# Patient Record
Sex: Female | Born: 1960 | Race: White | Hispanic: No | Smoking: Never smoker
Health system: Southern US, Community
[De-identification: ages and names within clinical notes are randomized; demographics above are authoritative.]

## PROBLEM LIST (undated history)

## (undated) DIAGNOSIS — E611 Iron deficiency: Secondary | ICD-10-CM

## (undated) DIAGNOSIS — D649 Anemia, unspecified: Secondary | ICD-10-CM

## (undated) HISTORY — DX: Anemia, unspecified: D64.9

## (undated) HISTORY — DX: Iron deficiency: E61.1

## (undated) HISTORY — PX: OTHER SURGICAL HISTORY: SHX169

---

## 2009-01-24 ENCOUNTER — Other Ambulatory Visit: Admission: RE | Admit: 2009-01-24 | Discharge: 2009-01-24 | Payer: Self-pay | Admitting: Obstetrics and Gynecology

## 2012-12-29 ENCOUNTER — Encounter: Payer: Self-pay | Admitting: Obstetrics and Gynecology

## 2012-12-29 ENCOUNTER — Other Ambulatory Visit (HOSPITAL_COMMUNITY)
Admission: RE | Admit: 2012-12-29 | Discharge: 2012-12-29 | Disposition: A | Discharge status: Home / Self Care | Payer: Self-pay | Source: Ambulatory Visit | Attending: Obstetrics and Gynecology | Admitting: Obstetrics and Gynecology

## 2012-12-29 ENCOUNTER — Ambulatory Visit (INDEPENDENT_AMBULATORY_CARE_PROVIDER_SITE_OTHER): Payer: Federal, State, Local not specified - PPO | Admitting: Obstetrics and Gynecology

## 2012-12-29 VITALS — BP 124/70 | Ht 66.0 in | Wt 145.8 lb

## 2012-12-29 DIAGNOSIS — Z01419 Encounter for gynecological examination (general) (routine) without abnormal findings: Secondary | ICD-10-CM | POA: Insufficient documentation

## 2012-12-29 DIAGNOSIS — Z1151 Encounter for screening for human papillomavirus (HPV): Secondary | ICD-10-CM | POA: Insufficient documentation

## 2012-12-29 DIAGNOSIS — Z1212 Encounter for screening for malignant neoplasm of rectum: Secondary | ICD-10-CM

## 2012-12-29 NOTE — Progress Notes (Signed)
  Subjective:    Emily Mata is a 52 y.o. female who presents for an annual exam. The patient has no complaints today. The patient is sexually active. GYN screening history: last pap: was normal and 3 yr ago. The patient wears seatbelts: no. The patient participates in regular exercise: no. Has the patient ever been transfused or tattooed?: no. The patient reports that there is not domestic violence in her life.   Menstrual History: OB History   Grav Para Term Preterm Abortions TAB SAB Ect Mult Living                  Menarche age: normal*2Patient's last menstrual period was 12/15/2012.      Review of Systems Pertinent items are noted in HPI.    Objective:    BP 124/70  Ht 5\' 6"  (1.676 m)  Wt 145 lb 12.8 oz (66.134 kg)  BMI 23.54 kg/m2  LMP 12/15/2012  General Appearance:    Alert, cooperative, no distress, appears stated age  Head:    Normocephalic, without obvious abnormality, atraumatic  Eyes:    PERRL, conjunctiva/corneas clear, EOM's intact, fundi    benign, both eyes  Ears:    Normal TM's and external ear canals, both ears  Nose:   Nares normal, septum midline, mucosa normal, no drainage    or sinus tenderness  Throat:   Lips, mucosa, and tongue normal; teeth and gums normal  Neck:   Supple, symmetrical, trachea midline, no adenopathy;    thyroid:  no enlargement/tenderness/nodules; no carotid   bruit or JVD  Back:     Symmetric, no curvature, ROM normal, no CVA tenderness  Lungs:     Clear to auscultation bilaterally, respirations unlabored  Chest Wall:    No tenderness or deformity   Heart:    Regular rate and rhythm, S1 and S2 normal, no murmur, rub   or gallop  Breast Exam:    No tenderness, masses, or nipple abnormality  Abdomen:     Soft, non-tender, bowel sounds active all four quadrants,    no masses, no organomegaly  Genitalia:    Normal female without lesion, discharge or tenderness  Rectal:    Normal tone, normal prostate, no masses or tenderness;  guaiac negative stool rectocele to 90 degrees  Extremities:   Extremities normal, atraumatic, no cyanosis or edema  Pulses:   2+ and symmetric all extremities  Skin:   Skin color, texture, turgor normal, no rashes or lesions  Lymph nodes:   Cervical, supraclavicular, and axillary nodes normal  Neurologic:   CNII-XII intact, normal strength, sensation and reflexes    throughout  .    Assessment:    Healthy female exam.    Plan:     All questions answered. Thin prep Pap smear.

## 2012-12-29 NOTE — Patient Instructions (Addendum)
The llq pain is likely intestinal. Try stool softeners, first, such a miralax, increase fiber.

## 2013-03-17 ENCOUNTER — Other Ambulatory Visit: Payer: Self-pay

## 2014-01-25 ENCOUNTER — Ambulatory Visit (INDEPENDENT_AMBULATORY_CARE_PROVIDER_SITE_OTHER): Payer: Federal, State, Local not specified - PPO

## 2014-01-25 ENCOUNTER — Ambulatory Visit (INDEPENDENT_AMBULATORY_CARE_PROVIDER_SITE_OTHER): Payer: Federal, State, Local not specified - PPO | Admitting: Podiatrist

## 2014-01-25 ENCOUNTER — Encounter: Payer: Self-pay | Admitting: Podiatrist

## 2014-01-25 VITALS — BP 125/71 | HR 66 | Resp 18

## 2014-01-25 DIAGNOSIS — R52 Pain, unspecified: Secondary | ICD-10-CM

## 2014-01-25 DIAGNOSIS — M2040 Other hammer toe(s) (acquired), unspecified foot: Secondary | ICD-10-CM

## 2014-01-25 DIAGNOSIS — M2041 Other hammer toe(s) (acquired), right foot: Secondary | ICD-10-CM

## 2014-01-25 NOTE — Patient Instructions (Signed)
Hammer Toes Hammer toes is a condition in which one or more of your toes is permanently flexed. CAUSES  This happens when a muscle imbalance or abnormal bone length makes your small toes buckle. This causes the toe joint to contract and the strong cord-like bands that attach muscles to the bones (tendons) in your toes to shorten.  SIGNS AND SYMPTOMS  Common symptoms of flexible hammer toes include:   A buildup of skin cells (corns). Corns occur where boney bumps come in frequent contact with hard surfaces. For example, where your shoes press and rub.  Irritation.  Inflammation.  Pain.  Limited motion in your toes. DIAGNOSIS  Hammer toes are diagnosed through a physical exam of your toes. During the exam, your health care provider may try to reproduce your symptoms by manipulating your foot. Often, X-ray exams are done to determine the degree of deformity and to make sure that the cause is not a fracture.  TREATMENT  Hammer toes can be treated with corrective surgery. There are several types of surgical procedures that can treat hammer toes. The most common procedures include:  Arthroplasty--A portion of the joint is surgically removed and your toe is straightened. The gap fills in with fibrous tissue. This procedure helps treat pain and deformity and helps restore function.  Fusion--Cartilage between the two bones of the affected joint is taken out and the bones fuse together into one longer bone. This helps keep your toe stable and reduces pain but leaves your toe stiff, yet straight.  Implantation--A portion of your bone is removed and replaced with an implant to restore motion.  Flexor tendon transfers--This procedure repositions the tendons that curl the toes down (flexor tendons). This may be done to release the deforming force that causes your toe to buckle. Several of these procedures require fixing your toe with a pin that is visible at the tip of your toe. The pin keeps the toe  straight during healing. Your health care provider will remove the pin usually within 4-8 weeks after the procedure.  Document Released: 04/25/2000 Document Revised: 05/03/2013 Document Reviewed: 01/03/2013 [employer] Patient Information 2015 [employer]. This information is not intended to replace advice given to you by your health care provider. Make sure you discuss any questions you have with your health care provider.  

## 2014-01-25 NOTE — Progress Notes (Signed)
   Subjective:    Patient ID: Emily Mata, female    DOB: Apr 09, 1961, 53 y.o.   MRN: [id number]  HPI i am having trouble with the 4th toe on my right foot and has been going on for about 6 mos and burns and throbs and hurts to walk and there is no numbness or tingling and no swelling and hurts with shoes    Review of Systems  All other systems reviewed and are negative.      Objective:   Physical Exam Patient is awake, alert, and oriented x 3.  In no acute distress.  Vascular status is intact with palpable pedal pulses at 2/4 DP and PT bilateral and capillary refill time within normal limits. Neurological sensation is also intact bilaterally via Semmes Weinstein monofilament at 5/5 sites. Light touch, vibratory sensation, Achilles tendon reflex is intact. Dermatological exam reveals skin color, turger and texture as normal. No open lesions present.  Musculature intact with dorsiflexion, plantarflexion, inversion, eversion.  Hammertoe is present fourth digit of the right foot. Is semirigid and contracture and painful with ambulation.     Assessment & Plan:  Hammertoe fourth toe right foot  Plan: Discussed conservative treatments including shoe gear changes, padding and taping. Also discussed surgical options of fixing the hammertoe. She will consider her options and call if she would like to have the toe fixed otherwise she'll continue to try conservative treatments.

## 2014-08-21 ENCOUNTER — Encounter: Payer: Self-pay | Admitting: Obstetrics and Gynecology

## 2015-03-02 ENCOUNTER — Encounter: Payer: Self-pay | Admitting: *Deleted

## 2015-03-07 ENCOUNTER — Ambulatory Visit: Payer: Self-pay | Admitting: *Deleted

## 2015-05-08 ENCOUNTER — Encounter: Payer: Self-pay | Admitting: *Deleted

## 2015-05-16 ENCOUNTER — Encounter: Payer: Self-pay | Admitting: Vascular Surgery

## 2015-05-16 ENCOUNTER — Ambulatory Visit (INDEPENDENT_AMBULATORY_CARE_PROVIDER_SITE_OTHER): Payer: Federal, State, Local not specified - PPO | Admitting: *Deleted

## 2015-05-16 DIAGNOSIS — I8390 Asymptomatic varicose veins of unspecified lower extremity: Secondary | ICD-10-CM

## 2015-05-16 DIAGNOSIS — I8393 Asymptomatic varicose veins of bilateral lower extremities: Secondary | ICD-10-CM

## 2015-05-16 NOTE — Progress Notes (Signed)
X=.3% Sotradecol administered with a 27g butterfly.  Patient received a total of 6cc.  Treated all areas of concern-combo of spiders and retics. Easy access. Tol well. Will follow her prn.  Photos: Yes.    Compression stockings applied: Yes.

## 2016-11-24 ENCOUNTER — Other Ambulatory Visit (HOSPITAL_COMMUNITY)
Admission: RE | Admit: 2016-11-24 | Discharge: 2016-11-24 | Disposition: A | Discharge status: Home / Self Care | Payer: Federal, State, Local not specified - PPO | Source: Ambulatory Visit | Attending: Obstetrics and Gynecology | Admitting: Obstetrics and Gynecology

## 2016-11-24 ENCOUNTER — Encounter: Payer: Self-pay | Admitting: Obstetrics and Gynecology

## 2016-11-24 ENCOUNTER — Ambulatory Visit (INDEPENDENT_AMBULATORY_CARE_PROVIDER_SITE_OTHER): Payer: Federal, State, Local not specified - PPO | Admitting: Obstetrics and Gynecology

## 2016-11-24 VITALS — BP 150/80 | HR 66 | Ht 67.5 in | Wt 149.4 lb

## 2016-11-24 DIAGNOSIS — Z1212 Encounter for screening for malignant neoplasm of rectum: Secondary | ICD-10-CM

## 2016-11-24 DIAGNOSIS — Z01419 Encounter for gynecological examination (general) (routine) without abnormal findings: Secondary | ICD-10-CM | POA: Diagnosis not present

## 2016-11-24 DIAGNOSIS — N951 Menopausal and female climacteric states: Secondary | ICD-10-CM | POA: Insufficient documentation

## 2016-11-24 LAB — HEMOCCULT GUIAC POC 1CARD (OFFICE): Fecal Occult Blood, POC: NEGATIVE

## 2016-11-24 MED ORDER — MEDROXYPROGESTERONE ACETATE 10 MG PO TABS: 10.0000 mg | ORAL_TABLET | Freq: Every day | ORAL | 3 refills | Status: DC

## 2016-11-24 NOTE — Progress Notes (Signed)
  Assessment:  Annual Gyn Exam  perimenopause Plan:  1. pap smear done, next pap due 3 years 2. return annually or prn 3    Annual mammogram advised  4.  Provera prescribed 10 mg x 14 d if no menses in 3 months. Subjective:  Emily Mata is a 56 y.o. female G2P2002 who presents for annual exam. Patient's last menstrual period was 10/29/2016 (exact date). The patient has complaints today of irregular heavy periods since last year. Her second last period was in 05/18, and her last period was from June 19th to July 2nd. She reports her LMP was very heavy. Pt has associated symptoms of intermittent hot sweats. She reports no alleviating factors. She is concerned about menopause. She has occasional problems with hemorrhoids. Pt sees PCP Dr. Jearl KlinefelterEggleston for her regular check ups. She had her colonoscopy done 09/17.  The following portions of the patient's history were reviewed and updated as appropriate: allergies, current medications, past family history, past medical history, past social history, past surgical history and problem list. Past Medical History:  Diagnosis Date  . Iron deficiency     Past Surgical History:  Procedure Laterality Date  . urinary tract surgery       Current Outpatient Prescriptions:  .  metroNIDAZOLE (METROCREAM) 0.75 % cream, Apply 1 application topically at bedtime. , Disp: , Rfl:  .  UNABLE TO FIND, daily. Anti-inflammatory-once daily; Mega foods-once daily; Blood builder-once-twice a week, Disp: , Rfl:   Review of Systems Constitutional: negative Gastrointestinal: negative Genitourinary: Negative  Objective:  BP (!) 150/80 (BP Location: Right Arm, Patient Position: Sitting, Cuff Size: Normal)   Pulse 66   Ht 5' 7.5" (1.715 m)   Wt 149 lb 6.4 oz (67.8 kg)   LMP 10/29/2016 (Exact Date)   BMI 23.05 kg/m    BMI: Body mass index is 23.05 kg/m.  General Appearance: Alert, appropriate appearance for age. No acute distress HEENT: Grossly normal Neck /  Thyroid:  Cardiovascular: RRR; normal S1, S2, no murmur Lungs: CTA bilaterally Back: No CVAT Breast Exam: No dimpling, nipple retraction or discharge. No masses or nodes., Normal to inspection, Normal breast tissue bilaterally. Gastrointestinal: Soft, non-tender, no masses or organomegaly Pelvic Exam: Vulva and vagina appear normal. Bimanual exam reveals normal uterus and adnexa. Vaginal: normal mucosa without prolapse or lesions, normal secretions Cervix: normal secretions Uterus: normal Adnexa: normal bimanual exam  Rectovaginal: guaiac negative stool obtained, internal hemorrhoids, moderate rectocele Lymphatic Exam: Non-palpable nodes in neck, clavicular, axillary, or inguinal regions  Skin: no rash or abnormalities Neurologic: Normal gait and speech, no tremor  Psychiatric: Alert and oriented, appropriate affect.  Urinalysis: Not done  Christin BachJohn Vernie Vinciguerra. MD Pgr [phone] 10:11 AM     By signing my name below, I, Redge GainerIzna Ahmed, attest that this documentation has been prepared under the direction and in the presence of Tilda BurrowFerguson, Justinian Miano V, MD. Electronically Signed: Redge GainerIzna Ahmed, ED Scribe. 11/24/16. 10:11 AM.  I personally performed the services described in this documentation, which was SCRIBED in my presence. The recorded information has been reviewed and considered accurate. It has been edited as necessary during review. Tilda BurrowFERGUSON,Damita Eppard V, MD

## 2016-11-26 LAB — CYTOLOGY - PAP
DIAGNOSIS: NEGATIVE
HPV (WINDOPATH): NOT DETECTED

## 2017-07-07 ENCOUNTER — Ambulatory Visit: Payer: Federal, State, Local not specified - PPO | Admitting: Adult Health

## 2017-07-07 ENCOUNTER — Encounter: Payer: Self-pay | Admitting: Adult Health

## 2017-07-07 VITALS — BP 120/80 | HR 98 | Ht 66.0 in | Wt 150.0 lb

## 2017-07-07 DIAGNOSIS — N921 Excessive and frequent menstruation with irregular cycle: Secondary | ICD-10-CM | POA: Diagnosis not present

## 2017-07-07 DIAGNOSIS — N946 Dysmenorrhea, unspecified: Secondary | ICD-10-CM

## 2017-07-07 DIAGNOSIS — R51 Headache: Secondary | ICD-10-CM

## 2017-07-07 DIAGNOSIS — N951 Menopausal and female climacteric states: Secondary | ICD-10-CM

## 2017-07-07 DIAGNOSIS — R0602 Shortness of breath: Secondary | ICD-10-CM | POA: Diagnosis not present

## 2017-07-07 DIAGNOSIS — R5383 Other fatigue: Secondary | ICD-10-CM | POA: Diagnosis not present

## 2017-07-07 NOTE — Patient Instructions (Signed)
Menopause and Hormone Replacement Therapy What is hormone replacement therapy? Hormone replacement therapy (HRT) is the use of artificial (synthetic) hormones to replace hormones that your body stops producing during menopause. Menopause is the normal time of life when menstrual periods stop completely and the ovaries stop producing the female hormones estrogen and progesterone. This lack of hormones can affect your health and cause undesirable symptoms. HRT can relieve some of those symptoms. What are my options for HRT? HRT may consist of the synthetic hormones estrogen and progestin, or it may consist of only estrogen (estrogen-only therapy). You and your health care provider will decide which form of HRT is best for you. If you choose to be on HRT and you have a uterus, estrogen and progestin are usually prescribed. Estrogen-only therapy is used for women who do not have a uterus. Possible options for taking HRT include:  Pills.  Patches.  Gels.  Sprays.  Vaginal cream.  Vaginal rings.  Vaginal inserts.  The amount of hormone(s) that you take and how long you take the hormone(s) varies depending on your individual health. It is important to:  Begin HRT with the lowest possible dosage.  Stop HRT as soon as your health care provider tells you to stop.  Work with your health care provider so that you feel informed and comfortable with your decisions.  What are the benefits of HRT? HRT can reduce the frequency and severity of menopausal symptoms. Benefits of HRT vary depending on the menopausal symptoms that you have, the severity of your symptoms, and your overall health. HRT may help to improve the following menopausal symptoms:  Hot flashes and night sweats. These are sudden feelings of heat that spread over the face and body. The skin may turn red, like a blush. Night sweats are hot flashes that happen while you are sleeping or trying to sleep.  Bone loss (osteoporosis). The  body loses calcium more quickly after menopause, causing the bones to become weaker. This can increase the risk for bone breaks (fractures).  Vaginal dryness. The lining of the vagina can become thin and dry, which can cause pain during sexual intercourse or cause infection, burning, or itching.  Urinary tract infections.  Urinary incontinence. This is a decreased ability to control when you urinate.  Irritability.  Short-term memory problems.  What are the risks of HRT? Risks of HRT vary depending on your individual health and medical history. Risks of HRT also depend on whether you receive both estrogen and progestin or you receive estrogen only.HRT may increase the risk of:  Spotting. This is when a small amount of bloodleaks from the vagina unexpectedly.  Endometrial cancer. This cancer is in the lining of the uterus (endometrium).  Breast cancer.  Increased density of breast tissue. This can make it harder to find breast cancer on a breast X-ray (mammogram).  Stroke.  Heart attack.  Blood clots.  Gallbladder disease.  Risks of HRT can increase if you have any of the following conditions:  Endometrial cancer.  Liver disease.  Heart disease.  Breast cancer.  History of blood clots.  History of stroke.  How should I care for myself while I am on HRT?  Take over-the-counter and prescription medicines only as told by your health care provider.  Get mammograms, pelvic exams, and medical checkups as often as told by your health care provider.  Have Pap tests done as often as told by your health care provider. A Pap test is sometimes called a Pap   smear. It is a screening test that is used to check for signs of cancer of the cervix and vagina. A Pap test can also identify the presence of infection or precancerous changes. Pap tests may be done: ? Every 3 years, starting at age 21. ? Every 5 years, starting after age 30, in combination with testing for human  papillomavirus (HPV). ? More often or less often depending on other medical conditions you have, your age, and other risk factors.  It is your responsibility to get your Pap test results. Ask your health care provider or the department performing the test when your results will be ready.  Keep all follow-up visits as told by your health care provider. This is important. When should I seek medical care? Talk with your health care provider if:  You have any of these: ? Pain or swelling in your legs. ? Shortness of breath. ? Chest pain. ? Lumps or changes in your breasts or armpits. ? Slurred speech. ? Pain, burning, or bleeding when you urine.  You develop any of these: ? Unusual vaginal bleeding. ? Dizziness or headaches. ? Weakness or numbness in any part of your arms or legs. ? Pain in your abdomen.  This information is not intended to replace advice given to you by your health care provider. Make sure you discuss any questions you have with your health care provider. Document Released: 01/25/2003 Document Revised: 03/25/2016 Document Reviewed: 10/30/2014 Elsevier Interactive Patient Education  2017 [employer] Menopause Menopause is the normal time of life when menstrual periods stop completely. Menopause is complete when you have missed 12 consecutive menstrual periods. It usually occurs between the ages of 48 years and 55 years. Very rarely does a woman develop menopause before the age of 40 years. At menopause, your ovaries stop producing the female hormones estrogen and progesterone. This can cause undesirable symptoms and also affect your health. Sometimes the symptoms may occur 4-5 years before the menopause begins. There is no relationship between menopause and:  Oral contraceptives.  Number of children you had.  Race.  The age your menstrual periods started (menarche).  Heavy smokers and very thin women may develop menopause earlier in life. What are the  causes?  The ovaries stop producing the female hormones estrogen and progesterone. Other causes include:  Surgery to remove both ovaries.  The ovaries stop functioning for no known reason.  Tumors of the pituitary gland in the brain.  Medical disease that affects the ovaries and hormone production.  Radiation treatment to the abdomen or pelvis.  Chemotherapy that affects the ovaries.  What are the signs or symptoms?  Hot flashes.  Night sweats.  Decrease in sex drive.  Vaginal dryness and thinning of the vagina causing painful intercourse.  Dryness of the skin and developing wrinkles.  Headaches.  Tiredness.  Irritability.  Memory problems.  Weight gain.  Bladder infections.  Hair growth of the face and chest.  Infertility. More serious symptoms include:  Loss of bone (osteoporosis) causing breaks (fractures).  Depression.  Hardening and narrowing of the arteries (atherosclerosis) causing heart attacks and strokes.  How is this diagnosed?  When the menstrual periods have stopped for 12 straight months.  Physical exam.  Hormone studies of the blood. How is this treated? There are many treatment choices and nearly as many questions about them. The decisions to treat or not to treat menopausal changes is an individual choice made with your health care provider. Your health care provider can discuss   the treatments with you. Together, you can decide which treatment will work best for you. Your treatment choices may include:  Hormone therapy (estrogen and progesterone).  Non-hormonal medicines.  Treating the individual symptoms with medicine (for example antidepressants for depression).  Herbal medicines that may help specific symptoms.  Counseling by a psychiatrist or psychologist.  Group therapy.  Lifestyle changes including: ? Eating healthy. ? Regular exercise. ? Limiting caffeine and alcohol. ? Stress management and meditation.  No  treatment.  Follow these instructions at home:  Take the medicine your health care provider gives you as directed.  Get plenty of sleep and rest.  Exercise regularly.  Eat a diet that contains calcium (good for the bones) and soy products (acts like estrogen hormone).  Avoid alcoholic beverages.  Do not smoke.  If you have hot flashes, dress in layers.  Take supplements, calcium, and vitamin D to strengthen bones.  You can use over-the-counter lubricants or moisturizers for vaginal dryness.  Group therapy is sometimes very helpful.  Acupuncture may be helpful in some cases. Contact a health care provider if:  You are not sure you are in menopause.  You are having menopausal symptoms and need advice and treatment.  You are still having menstrual periods after age 55 years.  You have pain with intercourse.  Menopause is complete (no menstrual period for 12 months) and you develop vaginal bleeding.  You need a referral to a specialist (gynecologist, psychiatrist, or psychologist) for treatment. Get help right away if:  You have severe depression.  You have excessive vaginal bleeding.  You fell and think you have a broken bone.  You have pain when you urinate.  You develop leg or chest pain.  You have a fast pounding heart beat (palpitations).  You have severe headaches.  You develop vision problems.  You feel a lump in your breast.  You have abdominal pain or severe indigestion. This information is not intended to replace advice given to you by your health care provider. Make sure you discuss any questions you have with your health care provider. Document Released: 07/19/2003 Document Revised: 10/04/2015 Document Reviewed: 11/25/2012 Elsevier Interactive Patient Education  2017 Elsevier Inc.  

## 2017-07-07 NOTE — Progress Notes (Signed)
Subjective:     Patient ID: Emily Mata, female   DOB: 03-06-1961, 57 y.o.   MRN: [id number]  HPI Emily Mata is a 57 year old white female, in complaining of heavy periods, with cramps and BTB at times.Feels tired with headaches and short of breath with exertion. She saw Dr Emelda FearFerguson in July 2018 and had her pap and physical. Has been anemic in the past.  PCP is Dr Janeece [place]ggleston [place].   Review of Systems +heavy bleeding for 4/7 days BTB at times +cramps +tired +headaches and shortness of breath with exertion Voids more often No pain with sex  Reviewed past medical,surgical, social and family history. Reviewed medications and allergies.     Objective:   Physical Exam BP 120/80 (BP Location: Left Arm, Patient Position: Sitting, Cuff Size: Small)   Pulse 98   Ht 5\' 6"  (1.676 m)   Wt 150 lb (68 kg)   LMP 06/28/2017   BMI 24.21 kg/m  Skin warm and dry. Neck: mid line trachea, normal thyroid, good ROM, no lymphadenopathy noted. Lungs: clear to ausculation bilaterally. Cardiovascular: regular rate and rhythm. Will check labs and get GYN US to assess uterus and ovaries and will discuss options when results back.     Assessment:     1. Menorrhagia with irregular cycle   2. Dysmenorrhea   3. Perimenopause       Plan:     Check CBC with diff,CMP,TSH and FSH Return in 1 week for GYN US Review handout on menopause and menopause and HRT Will talk when results back

## 2017-07-08 ENCOUNTER — Telehealth: Payer: Self-pay | Admitting: Adult Health

## 2017-07-08 LAB — CBC WITH DIFFERENTIAL/PLATELET
BASOS ABS: 0 10*3/uL (ref 0.0–0.2)
Basos: 1 %
EOS (ABSOLUTE): 0.1 10*3/uL (ref 0.0–0.4)
Eos: 1 %
Hematocrit: 38.5 % (ref 34.0–46.6)
Hemoglobin: 13 g/dL (ref 11.1–15.9)
Immature Grans (Abs): 0 10*3/uL (ref 0.0–0.1)
Immature Granulocytes: 0 %
LYMPHS: 30 %
Lymphocytes Absolute: 1.6 10*3/uL (ref 0.7–3.1)
MCH: 29 pg (ref 26.6–33.0)
MCHC: 33.8 g/dL (ref 31.5–35.7)
MCV: 86 fL (ref 79–97)
MONOS ABS: 0.3 10*3/uL (ref 0.1–0.9)
Monocytes: 5 %
NEUTROS ABS: 3.4 10*3/uL (ref 1.4–7.0)
NEUTROS PCT: 63 %
PLATELETS: 239 10*3/uL (ref 150–379)
RBC: 4.49 x10E6/uL (ref 3.77–5.28)
RDW: 14.3 % (ref 12.3–15.4)
WBC: 5.4 10*3/uL (ref 3.4–10.8)

## 2017-07-08 LAB — FOLLICLE STIMULATING HORMONE: FSH: 22.5 m[IU]/mL

## 2017-07-08 LAB — COMPREHENSIVE METABOLIC PANEL
A/G RATIO: 2 (ref 1.2–2.2)
ALK PHOS: 70 IU/L (ref 39–117)
ALT: 14 IU/L (ref 0–32)
AST: 19 IU/L (ref 0–40)
Albumin: 4.8 g/dL (ref 3.5–5.5)
BUN/Creatinine Ratio: 17 (ref 9–23)
BUN: 16 mg/dL (ref 6–24)
Bilirubin Total: 0.2 mg/dL (ref 0.0–1.2)
CHLORIDE: 104 mmol/L (ref 96–106)
CO2: 21 mmol/L (ref 20–29)
Calcium: 10.1 mg/dL (ref 8.7–10.2)
Creatinine, Ser: 0.96 mg/dL (ref 0.57–1.00)
GFR calc non Af Amer: 67 mL/min/{1.73_m2} (ref >59)
GFR, EST AFRICAN AMERICAN: 78 mL/min/{1.73_m2} (ref >59)
GLUCOSE: 92 mg/dL (ref 65–99)
Globulin, Total: 2.4 g/dL (ref 1.5–4.5)
POTASSIUM: 4.6 mmol/L (ref 3.5–5.2)
Sodium: 142 mmol/L (ref 134–144)
TOTAL PROTEIN: 7.2 g/dL (ref 6.0–8.5)

## 2017-07-08 LAB — TSH: TSH: 4.55 u[IU]/mL — AB (ref 0.450–4.500)

## 2017-07-08 NOTE — Telephone Encounter (Signed)
Pt aware of labs, will recheck TSH(and free T4) in about 4 weeks, since slightly elevated,

## 2017-07-13 ENCOUNTER — Other Ambulatory Visit: Payer: Federal, State, Local not specified - PPO

## 2017-07-14 ENCOUNTER — Ambulatory Visit (INDEPENDENT_AMBULATORY_CARE_PROVIDER_SITE_OTHER): Payer: Federal, State, Local not specified - PPO

## 2017-07-14 DIAGNOSIS — N921 Excessive and frequent menstruation with irregular cycle: Secondary | ICD-10-CM | POA: Diagnosis not present

## 2017-07-14 DIAGNOSIS — N946 Dysmenorrhea, unspecified: Secondary | ICD-10-CM

## 2017-07-14 DIAGNOSIS — N83201 Unspecified ovarian cyst, right side: Secondary | ICD-10-CM | POA: Diagnosis not present

## 2017-07-14 NOTE — Progress Notes (Signed)
PELVIC ULTRASOUND:homogeneous anteverted uterus,wnl,EEC 16 mm,mult.simple nabothian cysts,simple right ovarian cyst 2.6 x 2.5 x 2.6 cm,normal left ovary,ovaries appear mobile

## 2017-07-15 ENCOUNTER — Telehealth: Payer: Self-pay | Admitting: Adult Health

## 2017-07-15 MED ORDER — NORETHINDRONE ACETATE 5 MG PO TABS: 5.0000 mg | ORAL_TABLET | Freq: Every day | ORAL | 3 refills | Status: DC

## 2017-07-15 NOTE — Telephone Encounter (Signed)
Pt aware US normal with simple cyst right ovary, will try aygestin 5 ng daily to stop periods from being so heavy. Reminded her to check TSH and  Free T4 in about 4 weeks

## 2017-09-23 ENCOUNTER — Other Ambulatory Visit: Payer: Federal, State, Local not specified - PPO

## 2017-09-23 DIAGNOSIS — Z1329 Encounter for screening for other suspected endocrine disorder: Secondary | ICD-10-CM

## 2017-09-24 LAB — T4, FREE: FREE T4: 1.29 ng/dL (ref 0.82–1.77)

## 2017-09-24 LAB — TSH: TSH: 2.89 u[IU]/mL (ref 0.450–4.500)

## 2017-09-30 ENCOUNTER — Other Ambulatory Visit: Payer: Self-pay | Admitting: Adult Health

## 2017-10-23 ENCOUNTER — Other Ambulatory Visit: Payer: Self-pay | Admitting: Adult Health

## 2017-11-19 ENCOUNTER — Other Ambulatory Visit: Payer: Self-pay | Admitting: Adult Health

## 2017-12-12 ENCOUNTER — Other Ambulatory Visit: Payer: Self-pay | Admitting: Adult Health

## 2018-03-15 ENCOUNTER — Other Ambulatory Visit: Payer: Self-pay | Admitting: Adult Health

## 2018-09-07 ENCOUNTER — Other Ambulatory Visit: Payer: Self-pay | Admitting: Adult Health

## 2019-01-12 ENCOUNTER — Encounter: Payer: Self-pay | Admitting: *Deleted

## 2019-01-13 ENCOUNTER — Ambulatory Visit: Payer: Federal, State, Local not specified - PPO | Admitting: Obstetrics and Gynecology

## 2019-01-13 ENCOUNTER — Other Ambulatory Visit: Payer: Self-pay

## 2019-01-13 ENCOUNTER — Encounter: Payer: Self-pay | Admitting: Obstetrics and Gynecology

## 2019-01-13 VITALS — BP 118/71 | HR 71 | Ht 66.0 in | Wt 153.8 lb

## 2019-01-13 DIAGNOSIS — R002 Palpitations: Secondary | ICD-10-CM | POA: Diagnosis not present

## 2019-01-13 NOTE — Progress Notes (Signed)
Patient ID: Emily Mata, female   DOB: 1960-06-27, 58 y.o.   MRN: [id number]    [facility] Visit  @DATE @            Patient name: Emily Mata MRN [id number]  Date of birth: 1960-12-17  CC & HPI:  Emily Mata is a 58 y.o. female presenting today to discuss the side effects of her medications and her iron levels.   The pt reports reports that she has leg cramps at night, lightheadedness, and fluttering in her chest. The fluttering sometimes occurs when she is laying down and sometimes when she stands up. She reports that she gets dizzy if she stands up too quickly. These symptoms began about 2 months ago.   She has been on Aygestin for almost a year and has not had a period since she started taking it. Before then, her periods were extremely heavy and lasted for a long time. The patient denies thyroid abnormalities, problems with her weight, frequent hot flashes, fever, chills or any other symptoms or complaints at this time.   ROS:  ROS  +lightheadedness +fluttering in her chest +leg cramps at night  -frequent hot flashes -thyroid abnormalities -fever -chills All systems are negative except as noted in the HPI and PMH.   Pertinent History Reviewed:   Reviewed: Medical         Past Medical History:  Diagnosis Date  . Anemia   . Iron deficiency                               Surgical Hx:    Past Surgical History:  Procedure Laterality Date  . urinary tract surgery     Medications: Reviewed & Updated - see associated section                       Current Outpatient Medications:  .  aspirin EC 81 MG tablet, Take 81 mg by mouth daily., Disp: , Rfl:  .  norethindrone (AYGESTIN) 5 MG tablet, TAKE 1 TABLET BY MOUTH EVERY DAY, Disp: 90 tablet, Rfl: 1 .  UNABLE TO FIND, daily. Anti-inflammatory-once daily; Mega foods-once daily; Blood builder-once-twice a week, Disp: , Rfl:    Social History: Reviewed -  reports that she has never smoked. She has never used  smokeless tobacco.  Objective Findings:  Vitals: Blood pressure 118/71, pulse 71, height 5\' 6"  (1.676 m), weight 153 lb 12.8 oz (69.8 kg), last menstrual period 02/07/2018.  PHYSICAL EXAMINATION General appearance - alert, well appearing, and in no distress, oriented to person, place, and time and normal appearing weight Mental status - alert, oriented to person, place, and time, normal mood, behavior, speech, dress, motor activity, and thought processes, affect appropriate to mood  PELVIC DEFERRED  Assessment & Plan:   A:  1. Heart palpitations r/o cardiac origin 2. Normal exam at this time  P:  1. Recommend F/U with PCP -- would recommend holter monitor 2. Discontinue Aygestin to see if periods have stopped, as she is perimenopausal 3. F/U in 3 months by phone  By signing my name below, I, De Burrs, attest that this documentation has been prepared under the direction and in the presence of Jonnie Kind, MD. Electronically Signed: De Burrs, Medical Scribe. 01/13/19. 10:25 AM.  I personally performed the services described in this documentation, which was SCRIBED in my presence. The recorded information has been  reviewed and considered accurate. It has been edited as necessary during review. Jonnie Kind, MD

## 2019-04-20 ENCOUNTER — Encounter: Payer: Self-pay | Admitting: Obstetrics and Gynecology

## 2019-04-20 ENCOUNTER — Telehealth (INDEPENDENT_AMBULATORY_CARE_PROVIDER_SITE_OTHER): Payer: Federal, State, Local not specified - PPO | Admitting: Obstetrics and Gynecology

## 2019-04-20 DIAGNOSIS — N951 Menopausal and female climacteric states: Secondary | ICD-10-CM | POA: Diagnosis not present

## 2019-04-20 DIAGNOSIS — G4700 Insomnia, unspecified: Secondary | ICD-10-CM

## 2019-04-20 MED ORDER — ESTRADIOL 0.1 MG/24HR TD PTTW: 1.0000 | MEDICATED_PATCH | TRANSDERMAL | 12 refills | Status: DC

## 2019-04-20 NOTE — Progress Notes (Signed)
Patient ID: Emily Mata, female   DOB: Aug 02, 1960, 58 y.o.   MRN: [id number]     TELEHEALTH GYNECOLOGY VIRTUAL VIDEO VISIT ENCOUNTER NOTE  Provider location: [facility]   I connected with Emily Mata on 04/20/2019 at  8:30 AM EST by MyChart Video Encounter at home and verified that I am speaking with the correct person using two identifiers.   I discussed the limitations, risks, security and privacy concerns of performing an evaluation and management service virtually and the availability of in person appointments. I also discussed with the patient that there may be a patient responsible charge related to this service. The patient expressed understanding and agreed to proceed.   History:  Emily Mata is a 58 y.o. G37P2002 female being evaluated today to discuss her medications. She was on Aygestin for almost a year and did not have a period while taking it. She discontinued it 3 months ago to see if she would have a period, as she is perimenopausal.   She had some spotting in October, but no other bleeding. Her hot sweats have been manageable and are only bad at night. She notes that she can handle the night sweats without hormone management right now. However, she is having trouble sleeping at night and tosses and turns a lot.  She denies any abnormal vaginal discharge, bleeding, pelvic pain or other concerns.        Past Medical History:  Diagnosis Date  . Anemia   . Iron deficiency    Past Surgical History:  Procedure Laterality Date  . urinary tract surgery     The following portions of the patient's history were reviewed and updated as appropriate: allergies, current medications, past family history, past medical history, past social history, past surgical history and problem list.   Health Maintenance:  Normal pap and negative HRHPV on 11/24/2016.  No mammogram on record.   Review of Systems:  Pertinent items noted in HPI and remainder  of comprehensive ROS otherwise negative.  Physical Exam:   General:  Alert, oriented and cooperative. Patient appears to be in no acute distress.  Mental Status: Normal mood and affect. Normal behavior. Normal judgment and thought content.   Respiratory: Normal respiratory effort, no problems with respiration noted  Rest of physical exam deferred due to type of encounter  Labs and Imaging No results found for this or any previous visit (from the past 336 hour(s)). No results found.     Assessment and Plan:         1. Hormone management 2. Perimenopausal 3. Vasomotor symptoms, insomnia  Plan: 1. Vivelle dot patches for 1 year 2. Pt will F/U via MyChart in 3 months. Will consider adding Prometrium at this time.  Discussion: Discussed with pt risks and benefits of hormone management such as Prempro, Prometrium, Provera and patches to help the pt's night sweats if they become unmanageable. At end of discussion, pt had opportunity to ask questions and has no further questions at this time.  Specific discussion of hormone management as noted above. Greater than 50% was spent in counseling and coordination of care with the patient.  Total time greater than: 20 minutes.   I discussed the assessment and treatment plan with the patient. The patient was provided an opportunity to ask questions and all were answered. The patient agreed with the plan and demonstrated an understanding of the instructions.   The patient was advised to call back or seek  an in-person evaluation/go to the ED if the symptoms worsen or if the condition fails to improve as anticipated.  I provided 20 minutes of face-to-face time during this encounter.  By signing my name below, I, De Burrs, attest that this documentation has been prepared under the direction and in the presence of Jonnie Kind, MD. Electronically Signed: De Burrs, Medical Scribe. 04/20/19. 9:36 AM.  I personally performed the services  described in this documentation, which was SCRIBED in my presence. The recorded information has been reviewed and considered accurate. It has been edited as necessary during review. Jonnie Kind, MD

## 2019-04-25 ENCOUNTER — Other Ambulatory Visit: Payer: Self-pay | Admitting: *Deleted

## 2019-04-25 MED ORDER — ESTRADIOL 0.1 MG/24HR TD PTTW: 1.0000 | MEDICATED_PATCH | TRANSDERMAL | 12 refills | Status: DC

## 2019-04-25 NOTE — Telephone Encounter (Signed)
Patient states pharmacy has not received medication Dr Glo Herring was going to send in.  Informed patient it looks like it was faxed but will resend in electronically. Pt verbalized understanding.

## 2019-05-02 ENCOUNTER — Other Ambulatory Visit: Payer: Self-pay | Admitting: *Deleted

## 2019-05-02 MED ORDER — ESTRADIOL 0.1 MG/24HR TD PTTW: 1.0000 | MEDICATED_PATCH | TRANSDERMAL | 12 refills | Status: DC

## 2019-05-02 NOTE — Telephone Encounter (Signed)
Patient states pharmacy has still not received her prescription for the estradiol patch. Faxed on Friday.  Will try to send electronically.

## 2019-09-12 ENCOUNTER — Other Ambulatory Visit: Payer: Self-pay | Admitting: Obstetrics and Gynecology

## 2019-12-06 NOTE — Progress Notes (Signed)
PATIENT ID: Emily Mata, female     DOB: 1961/03/03, 59 y.o.     MRN: [id number]   [facility] Visit  12/06/19     PATIENT NAME: Emily Mata     MRN [id number]     DOB: Mar 28, 1961  CC & HPI:  No chief complaint on file.  Emily Mata is a 59 y.o. female presenting today for an evaluation of abnormal vaginal bleeding.  She notes that she is having to change her menses products every 30 minutes to 2 hours due to the severity of the menses.  ROS:  Review of Systems  Constitutional: Negative.   HENT: Negative.   Eyes: Negative.   Respiratory: Negative.   Cardiovascular: Negative.   Gastrointestinal: Negative.   Genitourinary: Negative.   Musculoskeletal: Negative.   Skin: Negative.   Neurological: Negative.   Endo/Heme/Allergies: Negative.   Psychiatric/Behavioral: Negative.   All other systems reviewed and are negative.  Emily Mata is a 59 y.o. [id number] LMP 07/08/2017,she is here for a pelvic sonogram for menorrhagia. DONE 07/2017  Uterus                      7.2 x 3.7 x 4.6 cm, Vol 64 ml,homogeneous anteverted uterus,wnl  Endometrium          16 mm, symmetrical, wnl  Right ovary             2.6 x 2.9 x 2.7 cm, simple right ovarian cyst 2.6 x 2.5 x 2.6 cm  Left ovary                2.3 x 1.8 x 1.6 cm, wnl  Mult.,small, simple nabothian cysts.  Technician Comments:  PELVIC ULTRASOUND:homogeneous anteverted uterus,wnl,EEC 16 mm,mult.small,simple nabothian cysts,simple right ovarian cyst 2.6 x 2.5 x 2.6 cm,normal left ovary,ovaries appear mobile    Emily Mata 07/14/2017 2:49 PM  Clinical Impression and recommendations:  I have reviewed the sonogram results above, combined with the patient's current clinical course, below are my impressions and any appropriate recommendations for management based on the sonographic findings.  Normal uterus Endometrium is minimally thickened but consistent with proliferative pre menstrual width Ovaries  normal with physiologic cyst right ovarian cyst   Emily Mata   Pertinent History Reviewed:  Reviewed: Significant for [facility] for quite some time til started back in June. Medical         Past Medical History:  Diagnosis Date  . Anemia   . Iron deficiency                               Surgical Hx:    Past Surgical History:  Procedure Laterality Date  . urinary tract surgery     Medications: Reviewed & Updated - see associated section                       Current Outpatient Medications:  .  aspirin EC 81 MG tablet, Take 81 mg by mouth daily., Disp: , Rfl:  .  estradiol (VIVELLE-DOT) 0.1 MG/24HR patch, PLACE 1 PATCH (0.1 MG TOTAL) ONTO THE SKIN 2 (TWO) TIMES A WEEK., Disp: 24 patch, Rfl: 5 .  ferrous sulfate 325 (65 FE) MG EC tablet, Take 325 mg by mouth 2 times daily at 12 noon and 4 pm., Disp: , Rfl:  .  norethindrone (AYGESTIN) 5 MG tablet, TAKE 1  TABLET BY MOUTH EVERY DAY (Patient not taking: Reported on 04/20/2019), Disp: 90 tablet, Rfl: 1 .  UNABLE TO FIND, daily. Anti-inflammatory-once daily; Mega foods-once daily; Blood builder-once-twice a week, Disp: , Rfl:    Social History: Reviewed -  reports that she has never smoked. She has never used smokeless tobacco.  Objective Findings:  Vitals: There were no vitals taken for this visit.  PHYSICAL EXAMINATION General appearance - alert, well appearing, and in no distress, oriented to person, place, and time and normal appearing weight Mental status - alert, oriented to person, place, and time, normal mood, behavior, speech, dress, motor activity, and thought processes Chest - not examined Heart - not examined Abdomen - soft, nontender, nondistended, no masses or organomegaly Breasts -  Skin - normal coloration and turgor, no rashes, no suspicious skin lesions noted  PELVIC External genitalia - normal, small varicositiy and skin tag left labia minora Vulva - normal Vagina -  Normal  Cervix - lite amount dark  blood  Uterus - anteverted sounds to 9 cm  Adnexa - nontender [place] - n/a Rectal - not done Assessment & Plan:   A:  1.  PMB  P:  1.  for Endometrial biopsy  Patient given informed consent, verbal consent then signed copy in the chart, time out was performed. Appropriate time out taken. . The patient was placed in the lithotomy position and the cervix brought into view with sterile speculum.  Portio of cervix cleansed x 2 with betadine swabs.  A tenaculum was placed in the anterior lip of the cervix.  The uterus was sounded for depth of 9. A3 mm was introduced to into the uterus, suction created,  and an endometrial sample was obtained. All equipment was removed and accounted for.  The patient tolerated the procedure well.  GENEROUS AMOUNT OF TISSUE AND DARK BLOOD COLLECTED.  Patient given post procedure instructions. The patient will return in 2 weeks for results.    By signing my name below, I, Emily Mata, attest that this documentation has been prepared under the direction and in the presence of Tilda Burrow, MD. Electronically Signed: Mal Mata Medical Scribe. 12/06/19. 11:37 PM.  I personally performed the services described in this documentation, which was SCRIBED in my presence. The recorded information has been reviewed and considered accurate. It has been edited as necessary during review. Tilda Burrow, MD

## 2019-12-07 ENCOUNTER — Other Ambulatory Visit: Payer: Self-pay | Admitting: Obstetrics and Gynecology

## 2019-12-07 ENCOUNTER — Ambulatory Visit: Payer: Federal, State, Local not specified - PPO | Admitting: Obstetrics and Gynecology

## 2019-12-07 ENCOUNTER — Encounter: Payer: Self-pay | Admitting: Obstetrics and Gynecology

## 2019-12-07 VITALS — BP 127/70 | HR 69 | Ht 66.0 in | Wt 138.0 lb

## 2019-12-07 DIAGNOSIS — N950 Postmenopausal bleeding: Secondary | ICD-10-CM | POA: Diagnosis not present

## 2019-12-12 ENCOUNTER — Other Ambulatory Visit: Payer: Self-pay | Admitting: Obstetrics and Gynecology

## 2019-12-12 MED ORDER — PROGESTERONE 200 MG PO CAPS: 200.0000 mg | ORAL_CAPSULE | Freq: Every day | ORAL | 2 refills | Status: DC

## 2019-12-12 NOTE — Progress Notes (Signed)
It appears that Emily Mata has not yet been started on Prometrium . Will begin every other month x 14 days.

## 2019-12-13 ENCOUNTER — Telehealth: Payer: Self-pay | Admitting: Obstetrics and Gynecology

## 2019-12-13 NOTE — Telephone Encounter (Signed)
Patient has a question about her prescription. °

## 2019-12-13 NOTE — Telephone Encounter (Signed)
Patient called back but call dropped.

## 2019-12-13 NOTE — Telephone Encounter (Signed)
Attempted to call patient back and left vm that I am returning her call.

## 2020-06-03 ENCOUNTER — Telehealth: Payer: Federal, State, Local not specified - PPO | Admitting: Orthopedic Surgery

## 2020-06-03 DIAGNOSIS — R399 Unspecified symptoms and signs involving the genitourinary system: Secondary | ICD-10-CM | POA: Diagnosis not present

## 2020-06-03 MED ORDER — NITROFURANTOIN MONOHYD MACRO 100 MG PO CAPS: 100.0000 mg | ORAL_CAPSULE | Freq: Two times a day (BID) | ORAL | 0 refills | Status: AC

## 2020-06-03 NOTE — Progress Notes (Signed)

## 2020-06-04 ENCOUNTER — Other Ambulatory Visit: Payer: Self-pay | Admitting: Obstetrics & Gynecology

## 2020-06-04 ENCOUNTER — Other Ambulatory Visit: Payer: Self-pay | Admitting: *Deleted

## 2020-06-04 MED ORDER — PROGESTERONE 200 MG PO CAPS: 200.0000 mg | ORAL_CAPSULE | Freq: Every day | ORAL | 2 refills | Status: DC

## 2020-06-04 NOTE — Telephone Encounter (Signed)
Verbal given to pharmacy. JSY 

## 2020-07-27 ENCOUNTER — Other Ambulatory Visit (HOSPITAL_COMMUNITY)
Admission: RE | Admit: 2020-07-27 | Discharge: 2020-07-27 | Disposition: A | Discharge status: Home / Self Care | Payer: Federal, State, Local not specified - PPO | Source: Ambulatory Visit | Attending: Obstetrics & Gynecology | Admitting: Obstetrics & Gynecology

## 2020-07-27 ENCOUNTER — Other Ambulatory Visit: Payer: Self-pay

## 2020-07-27 ENCOUNTER — Ambulatory Visit: Payer: Federal, State, Local not specified - PPO | Admitting: Obstetrics & Gynecology

## 2020-07-27 ENCOUNTER — Encounter: Payer: Self-pay | Admitting: Obstetrics & Gynecology

## 2020-07-27 VITALS — BP 124/67 | HR 74 | Ht 66.0 in | Wt 130.8 lb

## 2020-07-27 DIAGNOSIS — Z01419 Encounter for gynecological examination (general) (routine) without abnormal findings: Secondary | ICD-10-CM | POA: Insufficient documentation

## 2020-07-27 DIAGNOSIS — Z1231 Encounter for screening mammogram for malignant neoplasm of breast: Secondary | ICD-10-CM

## 2020-07-27 DIAGNOSIS — Z1329 Encounter for screening for other suspected endocrine disorder: Secondary | ICD-10-CM

## 2020-07-27 DIAGNOSIS — N939 Abnormal uterine and vaginal bleeding, unspecified: Secondary | ICD-10-CM

## 2020-07-27 DIAGNOSIS — Z131 Encounter for screening for diabetes mellitus: Secondary | ICD-10-CM

## 2020-07-27 DIAGNOSIS — Z13220 Encounter for screening for lipoid disorders: Secondary | ICD-10-CM

## 2020-07-27 MED ORDER — PROGESTERONE MICRONIZED 100 MG PO CAPS: 100.0000 mg | ORAL_CAPSULE | Freq: Every evening | ORAL | 3 refills | Status: DC

## 2020-07-27 NOTE — Progress Notes (Signed)
WELL-WOMAN EXAMINATION Patient name: Emily Mata MRN [id number]  Date of birth: [birth year] Chief Complaint:   Gynecologic Exam  History of Present Illness:   Emily Mata is a 60 y.o. G49P2002 Caucasian female being seen today for a routine well-woman exam and the following concerns:  AUB- Previously seen by Dr. Glo Herring- work up negative- started on patch twice weekly and prometrium x 14d every other month.   Today she notes that she is still bleeding- sometimes for a week sometimes longer.  Sometimes moderate, other heavy using several pads per day.  States that it is improved since her last visit, but still more than tolerable.  Also pt on vivelle dot for insomnia- however it does not seem to be helping.  Notes that sleep disturbance is due to social stressors- fiance passed away  Last pap 2016/07/29- outside facility.  Last mammogram: July 2021- outside facility. Last colonoscopy: per pt up to date   Depression screen [facility] 2/9 07/27/2020 11/24/2016  Decreased Interest 0 0  Down, Depressed, Hopeless 1 0  PHQ - 2 Score 1 0  Altered sleeping 1 -  Tired, decreased energy 1 -  Change in appetite 0 -  Feeling bad or failure about yourself  0 -  Trouble concentrating 0 -  Moving slowly or fidgety/restless 0 -  Suicidal thoughts 0 -  PHQ-9 Score 3 -      Review of Systems:   Pertinent items are noted in HPI Denies any headaches, blurred vision, fatigue, shortness of breath, chest pain, abdominal pain, bowel movements, urination, or intercourse unless otherwise stated above.  Pertinent History Reviewed:  Reviewed past medical,surgical, social and family history.  Reviewed problem list, medications and allergies. Physical Assessment:   Vitals:   07/27/20 1052  BP: 124/67  Pulse: 74  Weight: 130 lb 12.8 oz (59.3 kg)  Height: '5\' 6"'  (1.676 m)  Body mass index is 21.11 kg/m.        Physical Examination:   General appearance - well appearing, and in no distress  Mental status  - alert, oriented to person, place, and time  Psych:  She has a normal mood and affect  Skin - warm and dry, normal color, no suspicious lesions noted  Chest - effort normal, all lung fields clear to auscultation bilaterally  Heart - normal rate and regular rhythm  Neck:  midline trachea, no thyromegaly or nodules  Breasts - breasts appear normal, no suspicious masses, no skin or nipple changes or  axillary nodes  Abdomen - soft, nontender, nondistended, no masses or organomegaly  Pelvic - VULVA: normal appearing vulva with no masses, tenderness or lesions  VAGINA: normal appearing vagina with normal color and discharge, no lesions  CERVIX: normal appearing cervix without discharge or lesions, no CMT  Thin prep pap is done with HR HPV cotesting  UTERUS: uterus is felt to be normal size, shape, consistency and nontender   ADNEXA: No adnexal masses or tenderness noted.  Extremities:  No swelling or varicosities noted  Chaperone: Network engineer & Plan:  1) Well-Woman Exam -pap collected -mammo order placed -colonoscopy up to date  2) AUB Prior records reviewed- EMB negative 11/2019 -plan to transition to prometrium daily -plan to decrease vivelle dot to weekly, '[]'  may consider stopping at next visit -Follow up in 2-54mo, pending bleeding pattern- may consider alternative progesterone and/or D&C to r/o underlying etiology  Lab work fasting- orders placed  Orders Placed This Encounter  Procedures  .  MM Digital Diagnostic Bilat  . CBC  . Comp Met (CMET)  . Thyroid Panel With TSH  . Iron, TIBC and Ferritin Panel    Meds:  Meds ordered this encounter  Medications  . progesterone (PROMETRIUM) 100 MG capsule    Sig: Take 1 capsule (100 mg total) by mouth at bedtime.    Dispense:  90 capsule    Refill:  3    Follow-up: Return in about 3 months (around 10/27/2020) for Medication follow up, please print AVS.   Janyth Pupa, DO Attending [place],  [place] for [facility], [place]

## 2020-07-27 NOTE — Patient Instructions (Signed)
Please schedule a mammogram at one of the following locations:  [facility]: [phone]  [facility] [place]:[phone] [address]  As discussed- if possible decrease patch to once weekly- should you note any symptoms (worsening of sleep) ok to keep taking twice per week. Change prometrium to daily

## 2020-07-31 LAB — CYTOLOGY - PAP
Comment: NEGATIVE
Diagnosis: NEGATIVE
High risk HPV: NEGATIVE

## 2020-08-29 ENCOUNTER — Other Ambulatory Visit: Payer: Self-pay | Admitting: *Deleted

## 2020-08-29 DIAGNOSIS — Z1231 Encounter for screening mammogram for malignant neoplasm of breast: Secondary | ICD-10-CM

## 2020-09-27 NOTE — Progress Notes (Signed)
   GYN VISIT Patient name: Emily Mata MRN [id number]  Date of birth: [birth year] Chief Complaint:   Vaginal Bleeding and Medication Refill  History of Present Illness:   Emily Mata is a 60 y.o. [phone] postmenopausal female being seen today for follow up regarding:  -HRT: Plan was to try to ween of vivelle from twice weekly to weekly- today she notes that she is doing ok using just once per week.  Rates her symptoms about 3/10.  Notes some warmth at night, but not considerable.  Vaginal bleeding: Notes almost daily bleeding, like "running water."  States the bleeding is about the same- using both a super tampon and pad within 2 hours.  She feels like she spends more of the month bleeding than not.  The change in progesterone did not make a difference.  Records reviewed- prior work up completed EMB- 11/2019- benign endometrium Last [place] 07/2017- 7.2cm uterus- homogeneous anteverted uterus,wnl,EEC 16 mm,mult.small,simple nabothian cysts,simple right ovarian cyst 2.6 x 2.5 x 2.6 cm,normal left ovary,ovaries appear mobile  Depression screen [facility] 2/9 07/27/2020 11/24/2016  Decreased Interest 0 0  Down, Depressed, Hopeless 1 0  PHQ - 2 Score 1 0  Altered sleeping 1 -  Tired, decreased energy 1 -  Change in appetite 0 -  Feeling bad or failure about yourself  0 -  Trouble concentrating 0 -  Moving slowly or fidgety/restless 0 -  Suicidal thoughts 0 -  PHQ-9 Score 3 -     Review of Systems:   Pertinent items are noted in HPI Denies fever/chills, dizziness, headaches, visual disturbances, fatigue, shortness of breath, chest pain, abdominal pain, vomiting, bowel movements, urination, or intercourse unless otherwise stated above.  Pertinent History Reviewed:  Reviewed past medical,surgical, social, obstetrical and family history.  Reviewed problem list, medications and allergies. Physical Assessment:   Vitals:   10/01/20 1044  BP: 126/76  Pulse: 79  Weight: 57.3 kg  Height: 5\' 6"   (1.676 m)  Body mass index is 20.4 kg/m.       Physical Examination:   General appearance: alert, well appearing, and in no distress  Psych: mood appropriate, normal affect  Skin: warm & dry   Cardiovascular: normal heart rate noted  Respiratory: normal respiratory effort, no distress  Abdomen: soft, non-tender   Pelvic: normal external genitalia, vulva, vagina, cervix, uterus and adnexa, uterine descent and mobility noted  Extremities: no edema   Chaperone: declined    Assessment & Plan:  1) AUB -plan to stop estrogen -transition from prometrium to megace -Reviewed conservative vs surgical intervention.  Discussed plan for total vaginal hysterectomy, possible bilateral salpingectomy.  Risk/benefit and alternatives reviewed including but not limited to risk of bleeding, infection and injury to surrounding organs.  Questions and concerns were addressed and she would prefer to proceed with surgical intervention -plan to schedule at next available  2) h/o ovarian cyst -plan to repeat in light of AUB and prior ovarian cyst to confirm resolution -if ovarian cyst still present will evaluate at time of surgery  No orders of the defined types were placed in this encounter.   Return for TBD- pending surgery.   [place], DO Attending Obstetrician & Gynecologist, [facility] for [facility]

## 2020-10-01 ENCOUNTER — Encounter: Payer: Self-pay | Admitting: Obstetrics & Gynecology

## 2020-10-01 ENCOUNTER — Ambulatory Visit: Payer: Federal, State, Local not specified - PPO | Admitting: Obstetrics & Gynecology

## 2020-10-01 ENCOUNTER — Other Ambulatory Visit: Payer: Self-pay

## 2020-10-01 ENCOUNTER — Other Ambulatory Visit: Payer: Self-pay | Admitting: Adult Health

## 2020-10-01 VITALS — BP 126/76 | HR 79 | Ht 66.0 in | Wt 126.4 lb

## 2020-10-01 DIAGNOSIS — Z8742 Personal history of other diseases of the female genital tract: Secondary | ICD-10-CM

## 2020-10-01 DIAGNOSIS — N939 Abnormal uterine and vaginal bleeding, unspecified: Secondary | ICD-10-CM

## 2020-10-01 MED ORDER — MEGESTROL ACETATE 20 MG PO TABS: 20.0000 mg | ORAL_TABLET | Freq: Two times a day (BID) | ORAL | 1 refills | Status: DC

## 2020-10-02 LAB — COMPREHENSIVE METABOLIC PANEL
ALT: 15 IU/L (ref 0–32)
AST: 19 IU/L (ref 0–40)
Albumin/Globulin Ratio: 1.7 (ref 1.2–2.2)
Albumin: 4.7 g/dL (ref 3.8–4.9)
Alkaline Phosphatase: 80 IU/L (ref 44–121)
BUN/Creatinine Ratio: 12 (ref 9–23)
BUN: 12 mg/dL (ref 6–24)
Bilirubin Total: 0.3 mg/dL (ref 0.0–1.2)
CO2: 23 mmol/L (ref 20–29)
Calcium: 9.5 mg/dL (ref 8.7–10.2)
Chloride: 100 mmol/L (ref 96–106)
Creatinine, Ser: 0.97 mg/dL (ref 0.57–1.00)
Globulin, Total: 2.7 g/dL (ref 1.5–4.5)
Glucose: 99 mg/dL (ref 65–99)
Potassium: 4.6 mmol/L (ref 3.5–5.2)
Sodium: 140 mmol/L (ref 134–144)
Total Protein: 7.4 g/dL (ref 6.0–8.5)
eGFR: 68 mL/min/{1.73_m2} (ref >59)

## 2020-10-02 LAB — IRON AND TIBC
Iron Saturation: 7 % — CL (ref 15–55)
Iron: 30 ug/dL (ref 27–159)
Total Iron Binding Capacity: 406 ug/dL (ref 250–450)
UIBC: 376 ug/dL (ref 131–425)

## 2020-10-02 LAB — CBC/DIFF AMBIGUOUS DEFAULT
Basophils Absolute: 0 10*3/uL (ref 0.0–0.2)
Basos: 0 %
EOS (ABSOLUTE): 0 10*3/uL (ref 0.0–0.4)
Eos: 1 %
Hematocrit: 41.3 % (ref 34.0–46.6)
Hemoglobin: 13.3 g/dL (ref 11.1–15.9)
Immature Grans (Abs): 0 10*3/uL (ref 0.0–0.1)
Immature Granulocytes: 0 %
Lymphocytes Absolute: 1.8 10*3/uL (ref 0.7–3.1)
Lymphs: 38 %
MCH: 26.6 pg (ref 26.6–33.0)
MCHC: 32.2 g/dL (ref 31.5–35.7)
MCV: 83 fL (ref 79–97)
Monocytes Absolute: 0.3 10*3/uL (ref 0.1–0.9)
Monocytes: 5 %
Neutrophils Absolute: 2.7 10*3/uL (ref 1.4–7.0)
Neutrophils: 56 %
Platelets: 259 10*3/uL (ref 150–450)
RBC: 5 x10E6/uL (ref 3.77–5.28)
RDW: 13.3 % (ref 11.7–15.4)
WBC: 4.8 10*3/uL (ref 3.4–10.8)

## 2020-10-02 LAB — FERRITIN: Ferritin: 13 ng/mL — ABNORMAL LOW (ref 15–150)

## 2020-10-02 LAB — TSH: TSH: 2.29 u[IU]/mL (ref 0.450–4.500)

## 2020-10-16 DIAGNOSIS — Z029 Encounter for administrative examinations, unspecified: Secondary | ICD-10-CM

## 2020-10-18 ENCOUNTER — Other Ambulatory Visit: Payer: Self-pay

## 2020-10-19 ENCOUNTER — Other Ambulatory Visit: Payer: Self-pay | Admitting: Obstetrics & Gynecology

## 2020-10-19 DIAGNOSIS — N939 Abnormal uterine and vaginal bleeding, unspecified: Secondary | ICD-10-CM

## 2020-10-22 ENCOUNTER — Other Ambulatory Visit: Payer: Self-pay

## 2020-10-22 ENCOUNTER — Ambulatory Visit (INDEPENDENT_AMBULATORY_CARE_PROVIDER_SITE_OTHER): Payer: Federal, State, Local not specified - PPO

## 2020-10-22 DIAGNOSIS — N939 Abnormal uterine and vaginal bleeding, unspecified: Secondary | ICD-10-CM | POA: Diagnosis not present

## 2020-10-22 NOTE — Progress Notes (Signed)
UT TA/TV: heterogeneous anteverted uterus,wnl,homogeneous thickened endometrium,EEC 10.2 mm,normal left ovary,right ovary not visualized,mult small simple nabothian cysts,no pain during ultrasound

## 2020-10-24 ENCOUNTER — Other Ambulatory Visit: Payer: Self-pay

## 2020-10-24 DIAGNOSIS — N939 Abnormal uterine and vaginal bleeding, unspecified: Secondary | ICD-10-CM

## 2020-10-24 NOTE — Patient Instructions (Signed)
Emily Mata  10/24/2020     @PREFPERIOPPHARMACY @   Your procedure is scheduled on 10/30/2020.   Report to [facility] at 0900 A.M.   Call this number if you have problems the morning of surgery:  [phone]   Remember:  Do not eat or drink after midnight.      Take these medicines the morning of surgery with A SIP OF WATER      NONE     Please brush your teeth.  Do not wear jewelry, make-up or nail polish.  Do not wear lotions, powders, or perfumes, or deodorant.  Do not shave 48 hours prior to surgery.  Men may shave face and neck.  Do not bring valuables to the hospital.  [facility] is not responsible for any belongings or valuables.  Contacts, dentures or bridgework may not be worn into surgery.  Leave your suitcase in the car.  After surgery it may be brought to your room.  For patients admitted to the hospital, discharge time will be determined by your treatment team.  Patients discharged the day of surgery will not be allowed to drive home and must have someone with them for 24 hours.    Special instructions:  DO NOT smoke tobacco or vape for 24 hours before your procedure.  Please read over the following fact sheets that you were given. Coughing and Deep Breathing, Blood Transfusion Information, Surgical Site Infection Prevention, Anesthesia Post-op Instructions, and Care and Recovery After Surgery      Vaginal Hysterectomy, Care After The following information offers guidance on how to care for yourself after your procedure. Your health care provider may also give you more specific instructions. If you have problems or questions, contact your health careprovider. What can I expect after the procedure? After the procedure, it is common to have: Pain in the lower abdomen and vagina. Vaginal bleeding and discharge for up to 1 week. You will need to use a sanitary pad after this procedure. Difficulty having a bowel movement  (constipation). Temporary problems emptying the bladder. Tiredness (fatigue). Poor appetite. Less interest in sex. Feelings of sadness or other emotions. If your ovaries were also removed, it is also common to have symptoms of menopause, such as hot flashes, night sweats, and lack of sleep (insomnia). Follow these instructions at home: Medicines  Take over-the-counter and prescription medicines only as told by your health care provider. Do not take aspirin or NSAIDs, such as ibuprofen. These medicines can cause bleeding. Ask your health care provider if the medicine prescribed to you: Requires you to avoid driving or using machinery. Can cause constipation. You may need to take these actions to prevent or treat constipation: Drink enough fluid to keep your urine pale yellow. Take over-the-counter or prescription medicines. Eat foods that are high in fiber, such as beans, whole grains, and fresh fruits and vegetables. Limit foods that are high in fat and processed sugars, such as fried or sweet foods.  Activity  Rest as told by your health care provider. Return to your normal activities as told by your health care provider. Ask your health care provider what activities are safe for you Avoid sitting for a long time without moving. Get up to take short walks every 1-2 hours. This is important to improve blood flow and breathing. Ask for help if you feel weak or unsteady. Try to have someone home with you for 1-2 weeks to help you with everyday  chores. Do not lift anything that is heavier than 10 lb (4.5 kg), or the limit that you are told, until your health care provider says that it is safe. If you were given a sedative during the procedure, it can affect you for several hours. Do not drive or operate machinery until your health care provider says that it is safe.  Lifestyle Do not use any products that contain nicotine or tobacco. These products include cigarettes, chewing tobacco, and  vaping devices, such as e-cigarettes. These can delay healing after surgery. If you need help quitting, ask your health care provider. Do not drink alcohol until your health care provider approves. General instructions Do not douche, use tampons, or have sex for at least 6 weeks, or as told by your health care provider. If you struggle with physical or emotional changes after your procedure, speak with your health care provider or a therapist. The stitches inside your vagina will dissolve over time and do not need to be taken out. Do not take baths, swim, or use a hot tub until your health care provider approves. You may only be allowed to take showers for 2-3 weeks Wear compression stockings as told by your health care provider. These stockings help to prevent blood clots and reduce swelling in your legs. Keep all follow-up visits. This is important. Contact a health care provider if: Your pain medicine is not helping. You have a fever. You have nausea or vomiting that does not go away. You feel dizzy. You have blood, pus, or a bad-smelling discharge from your vagina more than 1 week after the procedure. You continue to have trouble urinating 3-5 days after the procedure. Get help right away if: You have severe pain in your abdomen or back. You faint. You have heavy vaginal bleeding and blood clots, soaking through a sanitary pad in less than 1 hour. You have chest pain or shortness of breath. You have pain, swelling, or redness in your leg. These symptoms may represent a serious problem that is an emergency. Do not wait to see if the symptoms will go away. Get medical help right away. Call your local emergency services (911 in the [place] Do not drive yourself to the hospital. Summary After the procedure, it is common to have pain, vaginal bleeding, constipation, temporary problems emptying your bladder, and feelings of sadness or other emotions. Take over-the-counter and prescription  medicines only as told by your health care provider. Rest as told by your health care provider. Return to your normal activities as told by your health care provider. Contact a health care provider if your pain medicine is not helping, or you have a fever, dizziness, or trouble urinating several days after the procedure. Get help right away if you have severe pain in your abdomen or back, or if you faint, have heavy bleeding, or have chest pain or shortness of breath. This information is not intended to replace advice given to you by your health care provider. Make sure you discuss any questions you have with your healthcare provider. Document Revised: 12/30/2019 Document Reviewed: 12/30/2019 Elsevier Patient Education  2022 [employer]. General Anesthesia, Adult, Care After This sheet gives you information about how to care for yourself after your procedure. Your health care provider may also give you more specific instructions. If you have problems or questions, contact your health careprovider. What can I expect after the procedure? After the procedure, the following side effects are common: Pain or discomfort at the IV site.  Nausea. Vomiting. Sore throat. Trouble concentrating. Feeling cold or chills. Feeling weak or tired. Sleepiness and fatigue. Soreness and body aches. These side effects can affect parts of the body that were not involved in surgery. Follow these instructions at home: For the time period you were told by your health care provider:  Rest. Do not participate in activities where you could fall or become injured. Do not drive or use machinery. Do not drink alcohol. Do not take sleeping pills or medicines that cause drowsiness. Do not make important decisions or sign legal documents. Do not take care of children on your own.  Eating and drinking Follow any instructions from your health care provider about eating or drinking restrictions. When you feel hungry,  start by eating small amounts of foods that are soft and easy to digest (bland), such as toast. Gradually return to your regular diet. Drink enough fluid to keep your urine pale yellow. If you vomit, rehydrate by drinking water, juice, or clear broth. General instructions If you have sleep apnea, surgery and certain medicines can increase your risk for breathing problems. Follow instructions from your health care provider about wearing your sleep device: Anytime you are sleeping, including during daytime naps. While taking prescription pain medicines, sleeping medicines, or medicines that make you drowsy. Have a responsible adult stay with you for the time you are told. It is important to have someone help care for you until you are awake and alert. Return to your normal activities as told by your health care provider. Ask your health care provider what activities are safe for you. Take over-the-counter and prescription medicines only as told by your health care provider. If you smoke, do not smoke without supervision. Keep all follow-up visits as told by your health care provider. This is important. Contact a health care provider if: You have nausea or vomiting that does not get better with medicine. You cannot eat or drink without vomiting. You have pain that does not get better with medicine. You are unable to pass urine. You develop a skin rash. You have a fever. You have redness around your IV site that gets worse. Get help right away if: You have difficulty breathing. You have chest pain. You have blood in your urine or stool, or you vomit blood. Summary After the procedure, it is common to have a sore throat or nausea. It is also common to feel tired. Have a responsible adult stay with you for the time you are told. It is important to have someone help care for you until you are awake and alert. When you feel hungry, start by eating small amounts of foods that are soft and easy to  digest (bland), such as toast. Gradually return to your regular diet. Drink enough fluid to keep your urine pale yellow. Return to your normal activities as told by your health care provider. Ask your health care provider what activities are safe for you. This information is not intended to replace advice given to you by your health care provider. Make sure you discuss any questions you have with your healthcare provider. Document Revised: 01/12/2020 Document Reviewed: 08/11/2019 Elsevier Patient Education  2022 Elsevier Inc. How to Use Chlorhexidine for Bathing Chlorhexidine gluconate (CHG) is a germ-killing (antiseptic) solution that is used to clean the skin. It can get rid of the bacteria that normally live on the skin and can keep them away for about 24 hours. To clean your skin with CHG, you may be given: A  CHG solution to use in the shower or as part of a sponge bath. A prepackaged cloth that contains CHG. Cleaning your skin with CHG may help lower the risk for infection: While you are staying in the intensive care unit of the hospital. If you have a vascular access, such as a central line, to provide short-term or long-term access to your veins. If you have a catheter to drain urine from your bladder. If you are on a ventilator. A ventilator is a machine that helps you breathe by moving air in and out of your lungs. After surgery. What are the risks? Risks of using CHG include: A skin reaction. Hearing loss, if CHG gets in your ears. Eye injury, if CHG gets in your eyes and is not rinsed out. The CHG product catching fire. Make sure that you avoid smoking and flames after applying CHG to your skin. Do not use CHG: If you have a chlorhexidine allergy or have previously reacted to chlorhexidine. On babies younger than 69 months of age. How to use CHG solution Use CHG only as told by your health care provider, and follow the instructions on the label. Use the full amount of CHG as  directed. Usually, this is one bottle. During a shower Follow these steps when using CHG solution during a shower (unless your health care provider gives you different instructions): Start the shower. Use your normal soap and shampoo to wash your face and hair. Turn off the shower or move out of the shower stream. Pour the CHG onto a clean washcloth. Do not use any type of brush or rough-edged sponge. Starting at your neck, lather your body down to your toes. Make sure you follow these instructions: If you will be having surgery, pay special attention to the part of your body where you will be having surgery. Scrub this area for at least 1 minute. Do not use CHG on your head or face. If the solution gets into your ears or eyes, rinse them well with water. Avoid your genital area. Avoid any areas of skin that have broken skin, cuts, or scrapes. Scrub your back and under your arms. Make sure to wash skin folds. Let the lather sit on your skin for 1-2 minutes or as long as told by your health care provider. Thoroughly rinse your entire body in the shower. Make sure that all body creases and crevices are rinsed well. Dry off with a clean towel. Do not put any substances on your body afterward--such as powder, lotion, or perfume--unless you are told to do so by your health care provider. Only use lotions that are recommended by the manufacturer. Put on clean clothes or pajamas. If it is the night before your surgery, sleep in clean sheets.  During a sponge bath Follow these steps when using CHG solution during a sponge bath (unless your health care provider gives you different instructions): Use your normal soap and shampoo to wash your face and hair. Pour the CHG onto a clean washcloth. Starting at your neck, lather your body down to your toes. Make sure you follow these instructions: If you will be having surgery, pay special attention to the part of your body where you will be having surgery.  Scrub this area for at least 1 minute. Do not use CHG on your head or face. If the solution gets into your ears or eyes, rinse them well with water. Avoid your genital area. Avoid any areas of skin that have broken skin,  cuts, or scrapes. Scrub your back and under your arms. Make sure to wash skin folds. Let the lather sit on your skin for 1-2 minutes or as long as told by your health care provider. Using a different clean, wet washcloth, thoroughly rinse your entire body. Make sure that all body creases and crevices are rinsed well. Dry off with a clean towel. Do not put any substances on your body afterward--such as powder, lotion, or perfume--unless you are told to do so by your health care provider. Only use lotions that are recommended by the manufacturer. Put on clean clothes or pajamas. If it is the night before your surgery, sleep in clean sheets. How to use CHG prepackaged cloths Only use CHG cloths as told by your health care provider, and follow the instructions on the label. Use the CHG cloth on clean, dry skin. Do not use the CHG cloth on your head or face unless your health care provider tells you to. When washing with the CHG cloth: Avoid your genital area. Avoid any areas of skin that have broken skin, cuts, or scrapes. Before surgery Follow these steps when using a CHG cloth to clean before surgery (unless your health care provider gives you different instructions): Using the CHG cloth, vigorously scrub the part of your body where you will be having surgery. Scrub using a back-and-forth motion for 3 minutes. The area on your body should be completely wet with CHG when you are done scrubbing. Do not rinse. Discard the cloth and let the area air-dry. Do not put any substances on the area afterward, such as powder, lotion, or perfume. Put on clean clothes or pajamas. If it is the night before your surgery, sleep in clean sheets.  For general bathing Follow these steps when  using CHG cloths for general bathing (unless your health care provider gives you different instructions). Use a separate CHG cloth for each area of your body. Make sure you wash between any folds of skin and between your fingers and toes. Wash your body in the following order, switching to a new cloth after each step: The front of your neck, shoulders, and chest. Both of your arms, under your arms, and your hands. Your stomach and groin area, avoiding the genitals. Your right leg and foot. Your left leg and foot. The back of your neck, your back, and your buttocks. Do not rinse. Discard the cloth and let the area air-dry. Do not put any substances on your body afterward--such as powder, lotion, or perfume--unless you are told to do so by your health care provider. Only use lotions that are recommended by the manufacturer. Put on clean clothes or pajamas. Contact a health care provider if: Your skin gets irritated after scrubbing. You have questions about using your solution or cloth. Get help right away if: Your eyes become very red or swollen. Your eyes itch badly. Your skin itches badly and is red or swollen. Your hearing changes. You have trouble seeing. You have swelling or tingling in your mouth or throat. You have trouble breathing. You swallow any chlorhexidine. Summary Chlorhexidine gluconate (CHG) is a germ-killing (antiseptic) solution that is used to clean the skin. Cleaning your skin with CHG may help to lower your risk for infection. You may be given CHG to use for bathing. It may be in a bottle or in a prepackaged cloth to use on your skin. Carefully follow your health care provider's instructions and the instructions on the product label. Do not  use CHG if you have a chlorhexidine allergy. Contact your health care provider if your skin gets irritated after scrubbing. This information is not intended to replace advice given to you by your health care provider. Make sure you  discuss any questions you have with your healthcare provider. Document Revised: 09/09/2019 Document Reviewed: 10/14/2019 Elsevier Patient Education  2022 [employer]

## 2020-10-25 ENCOUNTER — Other Ambulatory Visit: Payer: Self-pay

## 2020-10-25 ENCOUNTER — Encounter (HOSPITAL_COMMUNITY): Payer: Self-pay

## 2020-10-25 ENCOUNTER — Encounter (HOSPITAL_COMMUNITY)
Admission: RE | Admit: 2020-10-25 | Discharge: 2020-10-25 | Disposition: A | Discharge status: Home / Self Care | Payer: Federal, State, Local not specified - PPO | Source: Ambulatory Visit | Attending: Obstetrics & Gynecology | Admitting: Obstetrics & Gynecology

## 2020-10-25 DIAGNOSIS — Z01812 Encounter for preprocedural laboratory examination: Secondary | ICD-10-CM | POA: Insufficient documentation

## 2020-10-25 LAB — TYPE AND SCREEN
ABO/RH(D): O POS
Antibody Screen: NEGATIVE

## 2020-10-26 ENCOUNTER — Ambulatory Visit: Payer: Federal, State, Local not specified - PPO | Admitting: Adult Health

## 2020-10-29 NOTE — H&P (Signed)
H&P  60 y.o. [id number] postmenopausal female who presents for scheduled total vaginal hysterectomy, possible bilateral salpingectomy due to AUB  In review- she had reported almost daily bleeding, like "running water."  States the bleeding is about the same- using both a super tampon and pad within 2 hours.  She feels like she spends more of the month bleeding than not.  The change in progesterone did not make a difference.  At time of her office visit, pt was on vivelle weekly and was advised to discontinue.   Work up as followed: EMB- 11/2019- benign endometrium Recent US: 10/22/20: 7.2cm uterus- anteverted uterus,wnl,homogeneous thickened endometrium,EEC 10.2 mm,normal left ovary,right ovary not visualized,mult small simple nabothian cysts  Last [place] 07/2017- 7.2cm uterus- homogeneous anteverted uterus,wnl,EEC 16 mm,mult.small,simple nabothian cysts,simple right ovarian cyst 2.6 x 2.5 x 2.6 cm,normal left ovary,ovaries appear mobile     Review of Systems:  Pertinent items are noted in HPI Denies fever/chills, dizziness, headaches, visual disturbances, fatigue, shortness of breath, chest pain, abdominal pain, vomiting, bowel movements, urination, or intercourse unless otherwise stated above. Pertinent History Reviewed:  Reviewed past medical,surgical, social, obstetrical and family history. Reviewed problem list, medications and allergies.  Exam completed in office: Physical Assessment:       Vitals:    10/01/20 1044  BP: 126/76  Pulse: 79  Weight: 57.3 kg  Height: 5\' 6"  (1.676 m)  Body mass index is 20.4 kg/m.        Physical Examination:              General appearance: alert, well appearing, and in no distress             Psych: mood appropriate, normal affect             Skin: warm & dry             Cardiovascular: normal heart rate noted             Respiratory: normal respiratory effort, no distress             Abdomen: soft, non-tender             Pelvic: normal external  genitalia, vulva, vagina, cervix, uterus and adnexa, uterine descent and mobility noted             Extremities: no edema   Chaperone: declined     Assessment & Plan:  60yo female who presents for scheduled vaginal hysterectomy, possible salpingectomy bilaterally due to abnormal uterine bleeding  -Risk/benefit and alternatives reviewed including but not limited to risk of bleeding, infection and injury to surrounding organs.  Questions and concerns were addressed and she would prefer to proceed with surgical intervention -NPO -LR @ 125cc/hr -Ancef 2g IV to OR -SCDs to OR   60yo, DO Attending Obstetrician & Gynecologist, [facility] for [facility]

## 2020-10-30 ENCOUNTER — Ambulatory Visit (HOSPITAL_COMMUNITY): Payer: Federal, State, Local not specified - PPO | Admitting: Anesthesiology

## 2020-10-30 ENCOUNTER — Other Ambulatory Visit: Payer: Federal, State, Local not specified - PPO

## 2020-10-30 ENCOUNTER — Encounter (HOSPITAL_COMMUNITY)
Admission: RE | Disposition: A | Discharge status: Home / Self Care | Payer: Self-pay | Source: Home / Self Care | Attending: Obstetrics & Gynecology

## 2020-10-30 ENCOUNTER — Ambulatory Visit (HOSPITAL_COMMUNITY)
Admission: RE | Admit: 2020-10-30 | Discharge: 2020-10-30 | Disposition: A | Discharge status: Home / Self Care | Payer: Federal, State, Local not specified - PPO | Attending: Obstetrics & Gynecology | Admitting: Obstetrics & Gynecology

## 2020-10-30 ENCOUNTER — Encounter (HOSPITAL_COMMUNITY): Payer: Self-pay | Admitting: Obstetrics & Gynecology

## 2020-10-30 DIAGNOSIS — N888 Other specified noninflammatory disorders of cervix uteri: Secondary | ICD-10-CM | POA: Insufficient documentation

## 2020-10-30 DIAGNOSIS — N939 Abnormal uterine and vaginal bleeding, unspecified: Secondary | ICD-10-CM | POA: Diagnosis present

## 2020-10-30 DIAGNOSIS — N951 Menopausal and female climacteric states: Secondary | ICD-10-CM

## 2020-10-30 HISTORY — PX: VAGINAL HYSTERECTOMY: SHX2639

## 2020-10-30 LAB — ABO/RH: ABO/RH(D): O POS

## 2020-10-30 SURGERY — HYSTERECTOMY, VAGINAL
Anesthesia: General

## 2020-10-30 MED ORDER — DEXAMETHASONE SODIUM PHOSPHATE 10 MG/ML IJ SOLN
INTRAMUSCULAR | Status: DC | PRN
  Administered 2020-10-30: 10 mg via INTRAVENOUS

## 2020-10-30 MED ORDER — ONDANSETRON HCL 4 MG/2ML IJ SOLN
INTRAMUSCULAR | Status: AC
  Administered 2020-10-30: 4 mg via INTRAVENOUS
  Filled 2020-10-30: qty 2

## 2020-10-30 MED ORDER — DOCUSATE SODIUM 100 MG PO CAPS: 100.0000 mg | ORAL_CAPSULE | Freq: Two times a day (BID) | ORAL | 0 refills | Status: DC

## 2020-10-30 MED ORDER — GABAPENTIN 300 MG PO CAPS: 300.0000 mg | ORAL_CAPSULE | ORAL | Status: AC

## 2020-10-30 MED ORDER — KETOROLAC TROMETHAMINE 30 MG/ML IJ SOLN
30.0000 mg | Freq: Once | INTRAMUSCULAR | Status: AC
  Administered 2020-10-30: 30 mg via INTRAVENOUS
  Filled 2020-10-30: qty 1

## 2020-10-30 MED ORDER — MIDAZOLAM HCL 2 MG/2ML IJ SOLN
INTRAMUSCULAR | Status: AC
  Filled 2020-10-30: qty 2

## 2020-10-30 MED ORDER — ONDANSETRON HCL 4 MG/2ML IJ SOLN: 4.0000 mg | Freq: Once | INTRAMUSCULAR | Status: AC

## 2020-10-30 MED ORDER — HYDROMORPHONE HCL 1 MG/ML IJ SOLN: 0.2500 mg | INTRAMUSCULAR | Status: DC | PRN

## 2020-10-30 MED ORDER — FENTANYL CITRATE (PF) 250 MCG/5ML IJ SOLN
INTRAMUSCULAR | Status: AC
  Filled 2020-10-30: qty 5

## 2020-10-30 MED ORDER — ACETAMINOPHEN 500 MG PO TABS: 1000.0000 mg | ORAL_TABLET | ORAL | Status: AC

## 2020-10-30 MED ORDER — OXYCODONE-ACETAMINOPHEN 5-325 MG PO TABS: 1.0000 | ORAL_TABLET | Freq: Four times a day (QID) | ORAL | 0 refills | Status: AC | PRN

## 2020-10-30 MED ORDER — 0.9 % SODIUM CHLORIDE (POUR BTL) OPTIME
TOPICAL | Status: DC | PRN
  Administered 2020-10-30: 1000 mL

## 2020-10-30 MED ORDER — MIDAZOLAM HCL 5 MG/5ML IJ SOLN
INTRAMUSCULAR | Status: DC | PRN
  Administered 2020-10-30: 2 mg via INTRAVENOUS

## 2020-10-30 MED ORDER — FENTANYL CITRATE (PF) 100 MCG/2ML IJ SOLN
50.0000 ug | INTRAMUSCULAR | Status: DC | PRN
Start: 2020-10-30 — End: 2020-10-30

## 2020-10-30 MED ORDER — ROCURONIUM BROMIDE 100 MG/10ML IV SOLN
INTRAVENOUS | Status: DC | PRN
  Administered 2020-10-30: 50 mg via INTRAVENOUS

## 2020-10-30 MED ORDER — PROPOFOL 10 MG/ML IV BOLUS
INTRAVENOUS | Status: AC
  Filled 2020-10-30: qty 20

## 2020-10-30 MED ORDER — LACTATED RINGERS IV SOLN: INTRAVENOUS | Status: DC

## 2020-10-30 MED ORDER — IBUPROFEN 400 MG PO TABS: 600.0000 mg | ORAL_TABLET | Freq: Four times a day (QID) | ORAL | Status: AC | PRN

## 2020-10-30 MED ORDER — CEFAZOLIN SODIUM-DEXTROSE 2-4 GM/100ML-% IV SOLN
2.0000 g | INTRAVENOUS | Status: AC
  Administered 2020-10-30: 2 g via INTRAVENOUS
  Filled 2020-10-30: qty 100

## 2020-10-30 MED ORDER — GABAPENTIN 300 MG PO CAPS
ORAL_CAPSULE | ORAL | Status: AC
  Administered 2020-10-30: 300 mg via ORAL
  Filled 2020-10-30: qty 1

## 2020-10-30 MED ORDER — METOCLOPRAMIDE HCL 5 MG/ML IJ SOLN
10.0000 mg | Freq: Once | INTRAMUSCULAR | Status: AC
  Administered 2020-10-30: 10 mg via INTRAVENOUS
  Filled 2020-10-30: qty 2

## 2020-10-30 MED ORDER — ORAL CARE MOUTH RINSE: 15.0000 mL | Freq: Once | OROMUCOSAL | Status: AC

## 2020-10-30 MED ORDER — SODIUM CHLORIDE 0.9 % IR SOLN
Status: DC | PRN
  Administered 2020-10-30: 3000 mL

## 2020-10-30 MED ORDER — CELECOXIB 400 MG PO CAPS
ORAL_CAPSULE | ORAL | Status: AC
  Filled 2020-10-30: qty 1

## 2020-10-30 MED ORDER — CHLORHEXIDINE GLUCONATE 0.12 % MT SOLN
15.0000 mL | Freq: Once | OROMUCOSAL | Status: AC
  Administered 2020-10-30: 15 mL via OROMUCOSAL

## 2020-10-30 MED ORDER — PROPOFOL 10 MG/ML IV BOLUS
INTRAVENOUS | Status: DC | PRN
  Administered 2020-10-30: 200 mg via INTRAVENOUS

## 2020-10-30 MED ORDER — SCOPOLAMINE 1 MG/3DAYS TD PT72: 1.0000 | MEDICATED_PATCH | Freq: Once | TRANSDERMAL | Status: DC

## 2020-10-30 MED ORDER — SCOPOLAMINE 1 MG/3DAYS TD PT72
MEDICATED_PATCH | TRANSDERMAL | Status: AC
  Administered 2020-10-30: 1.5 mg via TRANSDERMAL
  Filled 2020-10-30: qty 1

## 2020-10-30 MED ORDER — POVIDONE-IODINE 10 % EX SWAB: 2.0000 "application " | Freq: Once | CUTANEOUS | Status: DC

## 2020-10-30 MED ORDER — LIDOCAINE HCL (CARDIAC) PF 100 MG/5ML IV SOSY
PREFILLED_SYRINGE | INTRAVENOUS | Status: DC | PRN
  Administered 2020-10-30: 100 mg via INTRAVENOUS

## 2020-10-30 MED ORDER — FENTANYL CITRATE (PF) 100 MCG/2ML IJ SOLN
INTRAMUSCULAR | Status: DC | PRN
  Administered 2020-10-30: 100 ug via INTRAVENOUS
  Administered 2020-10-30: 50 ug via INTRAVENOUS

## 2020-10-30 MED ORDER — MEPERIDINE HCL 50 MG/ML IJ SOLN: 6.2500 mg | INTRAMUSCULAR | Status: DC | PRN

## 2020-10-30 MED ORDER — BUPIVACAINE-EPINEPHRINE 0.25% -1:200000 IJ SOLN
INTRAMUSCULAR | Status: DC | PRN
  Administered 2020-10-30: 26 mL

## 2020-10-30 MED ORDER — ONDANSETRON HCL 4 MG/2ML IJ SOLN
INTRAMUSCULAR | Status: DC | PRN
  Administered 2020-10-30: 4 mg via INTRAVENOUS

## 2020-10-30 MED ORDER — ACETAMINOPHEN 500 MG PO TABS
ORAL_TABLET | ORAL | Status: AC
  Administered 2020-10-30: 1000 mg via ORAL
  Filled 2020-10-30: qty 2

## 2020-10-30 MED ORDER — CELECOXIB 400 MG PO CAPS
400.0000 mg | ORAL_CAPSULE | ORAL | Status: AC
Start: 2020-10-30 — End: 2020-10-30
  Administered 2020-10-30: 400 mg via ORAL

## 2020-10-30 MED ORDER — SUGAMMADEX SODIUM 200 MG/2ML IV SOLN
INTRAVENOUS | Status: DC | PRN
  Administered 2020-10-30: 150 mg via INTRAVENOUS

## 2020-10-30 MED ORDER — BUPIVACAINE-EPINEPHRINE (PF) 0.25% -1:200000 IJ SOLN
INTRAMUSCULAR | Status: AC
  Filled 2020-10-30: qty 30

## 2020-10-30 SURGICAL SUPPLY — 30 items
COVER WAND RF STERILE (DRAPES) ×4 IMPLANT
DECANTER SPIKE VIAL GLASS SM (MISCELLANEOUS) ×4 IMPLANT
DRAPE STERI URO 9X17 APER PCH (DRAPES) ×4 IMPLANT
GAUZE 4X4 16PLY RFD (DISPOSABLE) ×4 IMPLANT
GAUZE PACKING IODOFORM 2 (PACKING) IMPLANT
GLOVE SRG 8 PF TXTR STRL LF DI (GLOVE) ×2 IMPLANT
GLOVE SURG ENC MOIS LTX SZ6.5 (GLOVE) ×4 IMPLANT
GLOVE SURG ENC MOIS LTX SZ8 (GLOVE) ×4 IMPLANT
GLOVE SURG UNDER POLY LF SZ7 (GLOVE) ×8 IMPLANT
GLOVE SURG UNDER POLY LF SZ8 (GLOVE) ×4
GOWN STRL REUS W/ TWL LRG LVL3 (GOWN DISPOSABLE) ×4 IMPLANT
GOWN STRL REUS W/TWL LRG LVL3 (GOWN DISPOSABLE) ×8
GOWN STRL REUS W/TWL XL LVL3 (GOWN DISPOSABLE) ×4 IMPLANT
IV NS IRRIG 3000ML ARTHROMATIC (IV SOLUTION) ×4 IMPLANT
KIT BLADEGUARD II DBL (SET/KITS/TRAYS/PACK) ×4 IMPLANT
KIT TURNOVER CYSTO (KITS) ×4 IMPLANT
NEEDLE HYPO 25X1 1.5 SAFETY (NEEDLE) ×4 IMPLANT
PACK PERI GYN (CUSTOM PROCEDURE TRAY) ×4 IMPLANT
PAD ARMBOARD 7.5X6 YLW CONV (MISCELLANEOUS) ×4 IMPLANT
SET BASIN LINEN APH (SET/KITS/TRAYS/PACK) ×4 IMPLANT
SPONGE LAP 18X18 RF (DISPOSABLE) ×8 IMPLANT
SUT VIC AB 0 CT1 27 (SUTURE) ×8
SUT VIC AB 0 CT1 27XCR 8 STRN (SUTURE) ×4 IMPLANT
SUT VIC AB 0 CT2 8-18 (SUTURE) IMPLANT
SUT VICRYL 0 TIES 12 18 (SUTURE) ×4 IMPLANT
SYR BULB IRRIG 60ML STRL (SYRINGE) ×4 IMPLANT
SYR CONTROL 10ML LL (SYRINGE) ×4 IMPLANT
TOWEL OR 17X26 4PK STRL BLUE (TOWEL DISPOSABLE) ×4 IMPLANT
TRAY FOLEY W/BAG SLVR 14FR (SET/KITS/TRAYS/PACK) ×4 IMPLANT
VERSALIGHT (MISCELLANEOUS) ×4 IMPLANT

## 2020-10-30 NOTE — Anesthesia Preprocedure Evaluation (Signed)
Anesthesia Evaluation  Patient identified by MRN, date of birth, ID band Patient awake    Reviewed: Allergy & Precautions, NPO status , Patient's Chart, lab work & pertinent test results  History of Anesthesia Complications Negative for: history of anesthetic complications  Airway Mallampati: II  TM Distance: >3 FB Neck ROM: Full    Dental  (+) Dental Advisory Given, Teeth Intact   Pulmonary neg pulmonary ROS,    Pulmonary exam normal breath sounds clear to auscultation       Cardiovascular Exercise Tolerance: Good Normal cardiovascular exam Rhythm:Regular Rate:Normal     Neuro/Psych negative neurological ROS  negative psych ROS   GI/Hepatic negative GI ROS, Neg liver ROS,   Endo/Other  negative endocrine ROS  Renal/GU negative Renal ROS     Musculoskeletal negative musculoskeletal ROS (+)   Abdominal   Peds  Hematology  (+) anemia ,   Anesthesia Other Findings   Reproductive/Obstetrics                             Anesthesia Physical Anesthesia Plan  ASA: 2  Anesthesia Plan: General   Post-op Pain Management:    Induction: Intravenous  PONV Risk Score and Plan: 4 or greater and Ondansetron, Dexamethasone, Midazolam and Scopolamine patch - Pre-op  Airway Management Planned: Oral ETT  Additional Equipment:   Intra-op Plan:   Post-operative Plan: Extubation in OR  Informed Consent: I have reviewed the patients History and Physical, chart, labs and discussed the procedure including the risks, benefits and alternatives for the proposed anesthesia with the patient or authorized representative who has indicated his/her understanding and acceptance.     Dental advisory given  Plan Discussed with: CRNA and Surgeon  Anesthesia Plan Comments:         Anesthesia Quick Evaluation

## 2020-10-30 NOTE — Op Note (Addendum)
OPERATIVE REPORT   INDICATIONS:abnormal uterine bleeding  PRE-OP DIAGNOSIS:Abnormal Bleeding   POST-OP DIAGNOSIS;Abnormal Bleeding   PROCEDURE:Procedure(s) (LRB): HYSTERECTOMY VAGINAL (N/A)   SURGEON:Chynna Buerkle M Sabre Romberger  ASSIST:Dr. Duane Lope  SPECIMENS:uterus with cervix  DISPOSITION OF SPECIMEN: pathology  EBL:40 mL UOP: 400cc IVF: 1900cc  COMPLICATIONS: none   PATIENT TO:  PACU - hemodynamically stable.  PROCEDURE DETAILS: The patient was taken to the operating room after appropriate identification and placed on the operating table.  After the attainment of adequate general anesthesia the patient was placed in the lithotomy position. The perineum and vagina were prepped and draped in the usual fashion.  A Foley catheter was inserted into the bladder and connected to straight drainage. A weighted speculum was placed in the posterior vagina.  The Gerilyn Pilgrim tenaculum were placed on the anterior and posterior lip of the cervix respectively. The cervicovaginal mucosa was injected with a dilute solution of .25% Lidocaine with epinephrine. The cervix was then circumferentially incised with the bovie and the bladder was dissected off the pubovesical cervical fascia. The posterior cul-de-sac was entered sharply. A heany clamp was placed over the uterosacral ligaments bilaterally., These were transected and suture ligated with 0 vicryl. The cardinal ligaments were then clamped bilaterally and transected and ligated using the ligasure device.  The anterior cul-de-sac was entered sharply without difficulty.The uterine arteries and the broad ligament were then serially clamped and ligated bilaterally using the ligasure. Excellent hemostasis was visualized. Both cornu were clamped with heany clamps, transected and the uterus was delivered. Attempt was made to isolate the fallopian tubes and ovaries; however, due to the high location of the pedicles, salpingectomy was not completed. Pelvis was irrrgated,  excellent hemostasis was obtained. Angle stitches were placed bilaterally using 0-vicryl.  The remainder of the vaginal cuff was closed with 0 vicryl in a running locked fashion. All instruments were removed from the vagina.  The patient tolerated the procedure without difficulty and was taken to the recovery room in stable condition.   Myna Hidalgo, DO Attending Obstetrician & Gynecologist, [facility] for [employer], [facility]

## 2020-10-30 NOTE — Anesthesia Procedure Notes (Signed)
Procedure Name: Intubation Date/Time: 10/30/2020 9:09 AM Performed by: Junious Silk, CRNA Pre-anesthesia Checklist: Patient identified, Emergency Drugs available, Suction available, Patient being monitored and Timeout performed Patient Re-evaluated:Patient Re-evaluated prior to induction Oxygen Delivery Method: Circle system utilized Preoxygenation: Pre-oxygenation with 100% oxygen Induction Type: IV induction Ventilation: Mask ventilation without difficulty Laryngoscope Size: Miller and 2 Grade View: Grade I Tube type: Oral Tube size: 7.0 mm Number of attempts: 1 Airway Equipment and Method: Stylet Placement Confirmation: ETT inserted through vocal cords under direct vision, positive ETCO2, CO2 detector and breath sounds checked- equal and bilateral Secured at: 21 cm Tube secured with: Tape Dental Injury: Teeth and Oropharynx as per pre-operative assessment

## 2020-10-30 NOTE — Anesthesia Postprocedure Evaluation (Signed)
Anesthesia Post Note  Patient: Emily Mata  Procedure(s) Performed: HYSTERECTOMY VAGINAL  Patient location during evaluation: PACU Anesthesia Type: General Level of consciousness: awake and alert and oriented Pain management: pain level controlled Vital Signs Assessment: post-procedure vital signs reviewed and stable Respiratory status: spontaneous breathing and respiratory function stable Cardiovascular status: blood pressure returned to baseline and stable Postop Assessment: no apparent nausea or vomiting Anesthetic complications: no   No notable events documented.   Last Vitals:  Vitals:   10/30/20 1230 10/30/20 1250  BP: (!) 150/75 127/73  Pulse: 66 70  Resp: 14 18  Temp:  36.7 C  SpO2: 99% 100%    Last Pain:  Vitals:   10/30/20 1250  TempSrc: Oral  PainSc: 3                  Vy Badley C Daniqua Campoy

## 2020-10-30 NOTE — Interval H&P Note (Signed)
History and Physical Interval Note:  10/30/2020 8:54 AM  Emily Mata  has presented today for surgery, with the diagnosis of Abnormal Bleeding.  The various methods of treatment have been discussed with the patient and family. After consideration of risks, benefits and other options for treatment, the patient has consented to  Procedure(s): HYSTERECTOMY VAGINAL (N/A) POSSIBLE BILATERAL  SALPINGECTOMY (Bilateral) as a surgical intervention.  The patient's history has been reviewed, patient examined, no change in status, stable for surgery.  I have reviewed the patient's chart and labs.  Questions were answered to the patient's satisfaction.     Sharon Seller

## 2020-10-30 NOTE — Transfer of Care (Signed)
Immediate Anesthesia Transfer of Care Note  Patient: Emily Mata  Procedure(s) Performed: HYSTERECTOMY VAGINAL  Patient Location: PACU  Anesthesia Type:General  Level of Consciousness: awake, alert  and oriented  Airway & Oxygen Therapy: Patient Spontanous Breathing and Patient connected to nasal cannula oxygen  Post-op Assessment: Report given to RN and Post -op Vital signs reviewed and stable  Post vital signs: Reviewed and stable  Last Vitals:  Vitals Value Taken Time  BP 106/53 10/30/20 1049  Temp    Pulse 73 10/30/20 1051  Resp 16 10/30/20 1051  SpO2 99 % 10/30/20 1051  Vitals shown include unvalidated device data.  Last Pain:  Vitals:   10/30/20 0815  TempSrc: Oral  PainSc: 0-No pain      Patients Stated Pain Goal: 6 (10/30/20 0815)  Complications: No notable events documented.

## 2020-10-31 ENCOUNTER — Encounter (HOSPITAL_COMMUNITY): Payer: Self-pay | Admitting: Obstetrics & Gynecology

## 2020-11-01 ENCOUNTER — Other Ambulatory Visit: Payer: Federal, State, Local not specified - PPO

## 2020-11-01 LAB — SURGICAL PATHOLOGY

## 2020-11-08 ENCOUNTER — Encounter: Payer: Self-pay | Admitting: Obstetrics & Gynecology

## 2020-11-08 ENCOUNTER — Other Ambulatory Visit: Payer: Self-pay

## 2020-11-08 ENCOUNTER — Ambulatory Visit (INDEPENDENT_AMBULATORY_CARE_PROVIDER_SITE_OTHER): Payer: Federal, State, Local not specified - PPO | Admitting: Obstetrics & Gynecology

## 2020-11-08 VITALS — BP 132/68 | HR 73 | Ht 66.0 in | Wt 132.0 lb

## 2020-11-08 DIAGNOSIS — Z9889 Other specified postprocedural states: Secondary | ICD-10-CM

## 2020-11-08 NOTE — Progress Notes (Signed)
  HPI: Patient returns for routine postoperative follow-up having undergone TVH on 10/30/20.  The patient's immediate postoperative recovery has been unremarkable. Since hospital discharge the patient reports no problems Bowels and bladder are working fine.   Current Outpatient Medications: docusate sodium (COLACE) 100 MG capsule, Take 1 capsule (100 mg total) by mouth 2 (two) times daily. (Patient taking differently: Take 100 mg by mouth daily.), Disp: 30 capsule, Rfl: 0 Multiple Vitamins-Minerals (MULTIVITAMIN WITH MINERALS) tablet, Take 1 tablet by mouth daily. With Iron, Disp: , Rfl:   Current Facility-Administered Medications: ibuprofen (ADVIL) tablet 600 mg, 600 mg, Oral, Q6H PRN, Ozan, Jennifer, DO     Blood pressure 132/68, pulse 73, height 5\' 6"  (1.676 m), weight 132 lb (59.9 kg), last menstrual period 10/01/2020.  Physical Exam: Abdomen soft benign Vagina normal post op Cuff intact healing well Bimanual no tenderness or masses   Diagnostic Tests:   Pathology: benign  Impression:     ICD-10-CM   1. S/P [facility] 10/30/20  Z98.890    No complications, doing great        Plan: Post op restrictions reviewed    Follow up: Return in about 4 weeks (around 12/06/2020) for Post Op with Dr 12/08/2020.     Charlotta Newton, MD

## 2020-12-06 ENCOUNTER — Ambulatory Visit (INDEPENDENT_AMBULATORY_CARE_PROVIDER_SITE_OTHER): Payer: Federal, State, Local not specified - PPO | Admitting: Obstetrics & Gynecology

## 2020-12-06 ENCOUNTER — Other Ambulatory Visit: Payer: Self-pay

## 2020-12-06 ENCOUNTER — Encounter: Payer: Self-pay | Admitting: Obstetrics & Gynecology

## 2020-12-06 VITALS — BP 136/76 | HR 68 | Wt 135.4 lb

## 2020-12-06 DIAGNOSIS — Z4889 Encounter for other specified surgical aftercare: Secondary | ICD-10-CM

## 2020-12-06 NOTE — Progress Notes (Signed)
   Postop visit  Emily Mata is a 60 y.o. G37P2002 female who presents for a postoperative visit. She is now ~ 6wk s/p TVH completed on 10/30/20. Today she notes that she is doing well.  On occasion she has some pain around her umbilicus, but minimal.  Does not require pain medication Denies fever or chills.  Tolerating gen diet.  +Flatus, Regular BMs.  Overall doing well and reports no acute complaints   Review of Systems Pertinent items noted in HPI and remainder of comprehensive ROS otherwise negative.    Objective:  BP 136/76 (BP Location: Right Arm, Patient Position: Sitting, Cuff Size: Normal)   Pulse 68   Wt 135 lb 6.4 oz (61.4 kg)   LMP 10/01/2020 (Exact Date)   BMI 21.85 kg/m    Physical Examination:  GENERAL ASSESSMENT: well developed and well nourished SKIN: normal color, no lesions CHEST: CTAB HEART: regular rate and rhythm ABDOMEN: soft, non-distended, +BS GU: normal external genitalia, vagina- pink moist mucosa, no abnormal discharge or lesions, vaginal cuff visible intact, on bimanual exam- cuff well healed, no adnexal masses noted, no fullness or tenderness appreciated EXTREMITY: no edema PSYCH: mood appropriate, normal affect       Assessment:    Postop TVH   Plan:   Meeting postop milestones appropriately May increase activity as tolerated Plan to return to work mid-August F/U prn or 1yr for annual  Myna Hidalgo, DO Attending Obstetrician & Gynecologist, Biochemist, clinical for [employer], [facility]

## 2021-03-07 ENCOUNTER — Telehealth: Payer: Self-pay | Admitting: Obstetrics & Gynecology

## 2021-03-07 NOTE — Telephone Encounter (Signed)
Returned pt's call, informed her of mammogram orders already placed, and gave her the phone number to call to schedule it at [facility]. Pt confirmed understanding.

## 2021-03-07 NOTE — Telephone Encounter (Signed)
Patient called stating that she would like to know if Dr. Charlotta Newton or Despina Hidden could call and make her an appointment with [facility] for a breast Mammogram. Please contact pt

## 2021-03-26 ENCOUNTER — Other Ambulatory Visit (HOSPITAL_COMMUNITY): Payer: Self-pay | Admitting: Obstetrics & Gynecology

## 2021-03-26 DIAGNOSIS — Z1231 Encounter for screening mammogram for malignant neoplasm of breast: Secondary | ICD-10-CM

## 2021-03-30 ENCOUNTER — Telehealth: Payer: Federal, State, Local not specified - PPO | Admitting: Nurse Practitioner

## 2021-03-30 DIAGNOSIS — R399 Unspecified symptoms and signs involving the genitourinary system: Secondary | ICD-10-CM | POA: Diagnosis not present

## 2021-03-30 MED ORDER — CEPHALEXIN 500 MG PO CAPS: 500.0000 mg | ORAL_CAPSULE | Freq: Two times a day (BID) | ORAL | 0 refills | Status: DC

## 2021-03-30 NOTE — Progress Notes (Signed)

## 2021-04-08 ENCOUNTER — Other Ambulatory Visit: Payer: Self-pay

## 2021-04-08 ENCOUNTER — Ambulatory Visit (HOSPITAL_COMMUNITY)
Admission: RE | Admit: 2021-04-08 | Discharge: 2021-04-08 | Disposition: A | Discharge status: Home / Self Care | Payer: Federal, State, Local not specified - PPO | Source: Ambulatory Visit | Attending: Obstetrics & Gynecology | Admitting: Obstetrics & Gynecology

## 2021-04-08 DIAGNOSIS — Z1231 Encounter for screening mammogram for malignant neoplasm of breast: Secondary | ICD-10-CM | POA: Insufficient documentation

## 2021-04-11 ENCOUNTER — Encounter: Payer: Self-pay | Admitting: Obstetrics & Gynecology

## 2021-04-11 ENCOUNTER — Telehealth: Payer: Federal, State, Local not specified - PPO | Admitting: Obstetrics & Gynecology

## 2021-04-11 DIAGNOSIS — N951 Menopausal and female climacteric states: Secondary | ICD-10-CM | POA: Diagnosis not present

## 2021-04-11 MED ORDER — ESTRADIOL 0.5 MG PO TABS
0.5000 mg | ORAL_TABLET | Freq: Every day | ORAL | 11 refills | Status: DC
Start: 2021-04-11 — End: 2021-05-07

## 2021-04-11 NOTE — Progress Notes (Signed)
   TELEHEALTH GYNECOLOGY VISIT ENCOUNTER NOTE  [facility]    Patient location: Home  I connected with [name] on 04/11/21 at  2:50 PM EST by telephone and verified that I am speaking with the correct person using two identifiers. Patient was unable to do MyChart audiovisual encounter due to technical difficulties, she tried several times.    I discussed the limitations, risks, security and privacy concerns of performing an evaluation and management service by telephone and the availability of in person appointments. I also discussed with the patient that there may be a patient responsible charge related to this service. The patient expressed understanding and agreed to proceed.   History:  NAMIAH DUNNAVANT is a 60 y.o. G30P2002 female being evaluated today for night sweats.  Initially, she was doing better with OTC supplements; however over the past 1-2 mos she has noted worsening of her symptoms.  She notes that the night sweats are happening frequently throughout the evening. The night sweats then make it difficult for her to return to sleep- notes considerable sleep disruption and feel exhausted throughout her day.  Currently rates her symptoms- 8/10.    She denies any abnormal vaginal discharge, bleeding, pelvic pain or other concerns.       Past Medical History:  Diagnosis Date   Anemia    Iron deficiency    Past Surgical History:  Procedure Laterality Date   urinary tract surgery     VAGINAL HYSTERECTOMY N/A 10/30/2020   Procedure: HYSTERECTOMY VAGINAL;  Surgeon: Myna Hidalgo, DO;  Location: [facility];  Service: Gynecology;  Laterality: N/A;   The following portions of the patient's history were reviewed and updated as appropriate: allergies, current medications, past family history, past medical history, past social history, past surgical history and problem list.    Review of Systems:  Pertinent items noted in HPI and  remainder of comprehensive ROS otherwise negative.  Physical Exam:   General:  Alert, oriented and cooperative.   Mental Status: Normal mood and affect perceived. Normal judgment and thought content.  Physical exam deferred due to nature of the encounter  Assessment and Plan:   -Vasomotor symptoms -reviewed medical management options- discussed lowest dose at shortest time frame.  Questions and concerns were addressed -plan to start on low dose oral tablet, f/u in 50mos (ok for televist)  HRT RISK Assessment Pt denies history of any of the following:  Untreated hypertension. Active liver disease with abnormal liver function tests. Active or recent arterial thromboembolic disease. Previous or current venous thromboembolism   I provided 10 minutes of non-face-to-face time during this encounter.   Sharon Seller, DO Center for [employer], [facility]

## 2021-05-07 ENCOUNTER — Other Ambulatory Visit: Payer: Self-pay

## 2021-05-07 DIAGNOSIS — N951 Menopausal and female climacteric states: Secondary | ICD-10-CM

## 2021-05-07 MED ORDER — ESTRADIOL 0.5 MG PO TABS: 0.5000 mg | ORAL_TABLET | Freq: Every day | ORAL | 4 refills | Status: DC

## 2021-06-17 ENCOUNTER — Telehealth (INDEPENDENT_AMBULATORY_CARE_PROVIDER_SITE_OTHER): Payer: Federal, State, Local not specified - PPO | Admitting: Obstetrics & Gynecology

## 2021-06-17 ENCOUNTER — Encounter: Payer: Self-pay | Admitting: Obstetrics & Gynecology

## 2021-06-17 DIAGNOSIS — Z79890 Hormone replacement therapy (postmenopausal): Secondary | ICD-10-CM | POA: Diagnosis not present

## 2021-06-17 DIAGNOSIS — N951 Menopausal and female climacteric states: Secondary | ICD-10-CM | POA: Diagnosis not present

## 2021-06-17 MED ORDER — ESTRADIOL 1 MG PO TABS: 1.0000 mg | ORAL_TABLET | Freq: Every day | ORAL | 4 refills | Status: DC

## 2021-06-17 NOTE — Progress Notes (Signed)
° °  TELEHEALTH GYNECOLOGY VISIT ENCOUNTER NOTE  Provider location: [facility]   Patient location: Home  I connected with Emily Mata on 06/17/21 at  8:30 AM EST by telephone and verified that I am speaking with the correct person using two identifiers. Patient was unable to do MyChart audiovisual encounter due to technical difficulties, she tried several times.    I discussed the limitations, risks, security and privacy concerns of performing an evaluation and management service by telephone and the availability of in person appointments. I also discussed with the patient that there may be a patient responsible charge related to this service. The patient expressed understanding and agreed to proceed.   History:  Emily Mata is a 61 y.o. G56P2002 female being evaluated today for follow up regarding HRT.   Last visit, she was started on estrace 0.5mg  daily.  She has noted some improvement of her night sweats- not every night, maybe 2/7.  Additionally more like flashes of heat.  Still having hot flashes especially in the evenings.  Would rate her symptoms 7/10 (previously 8/10).  Feels like we are moving in the right direction.    ROS: Pertinent items noted in HPI, otherwise negative She denies headache, dizziness, CP, shortness of breath.       Past Medical History:  Diagnosis Date   Anemia    Iron deficiency    Past Surgical History:  Procedure Laterality Date   urinary tract surgery     VAGINAL HYSTERECTOMY N/A 10/30/2020   Procedure: HYSTERECTOMY VAGINAL;  Surgeon: Janyth Pupa, DO;  Location: [facility];  Service: Gynecology;  Laterality: N/A;   The following portions of the patient's history were reviewed and updated as appropriate: allergies, current medications, past family history, past medical history, past social history, past surgical history and problem list.   Health Maintenance:    Normal mammogram on 03/2021.   Review of Systems:   Pertinent items noted in HPI and remainder of comprehensive ROS otherwise negative.  Physical Exam:   General:  Alert, oriented and cooperative.   Mental Status: Normal mood and affect perceived. Normal judgment and thought content.  Physical exam deferred due to nature of the encounter   Assessment and Plan:     -Vasomotor symtpoms -HRT  -Plan to increase to estrace 1mg  daily -Reviewed HRT and contraindications, which she does not have at this time -f/u in 3-38mos      I discussed the assessment and treatment plan with the patient. The patient was provided an opportunity to ask questions and all were answered. The patient agreed with the plan and demonstrated an understanding of the instructions.   I provided 15 minutes of non-face-to-face time during this encounter.   Annalee Genta, [place] for [employer], [place]

## 2021-11-18 ENCOUNTER — Encounter: Payer: Self-pay | Admitting: Obstetrics & Gynecology

## 2021-11-18 ENCOUNTER — Ambulatory Visit (INDEPENDENT_AMBULATORY_CARE_PROVIDER_SITE_OTHER): Payer: Federal, State, Local not specified - PPO | Admitting: Obstetrics & Gynecology

## 2021-11-18 VITALS — BP 130/75 | HR 66 | Ht 66.0 in | Wt 136.0 lb

## 2021-11-18 DIAGNOSIS — Z01419 Encounter for gynecological examination (general) (routine) without abnormal findings: Secondary | ICD-10-CM

## 2021-11-18 DIAGNOSIS — N951 Menopausal and female climacteric states: Secondary | ICD-10-CM | POA: Diagnosis not present

## 2021-11-18 DIAGNOSIS — Z79890 Hormone replacement therapy (postmenopausal): Secondary | ICD-10-CM | POA: Diagnosis not present

## 2021-11-18 MED ORDER — ESTRADIOL 1 MG PO TABS: 1.0000 mg | ORAL_TABLET | Freq: Every day | ORAL | 3 refills | Status: DC

## 2021-11-18 NOTE — Progress Notes (Signed)
Subjective:     Emily Mata is a 61 y.o. female here for a routine exam.  Patient's last menstrual period was 10/01/2020 (exact date). [id number] Birth Control Method:  TVH Menstrual Calendar(currently): amenorrheic  Current complaints: none.   Current acute medical issues:  none   Recent Gynecologic History Patient's last menstrual period was 10/01/2020 (exact date). Last Pap: na,   Last mammogram: 11/22,  normal  Past Medical History:  Diagnosis Date   Anemia    Iron deficiency     Past Surgical History:  Procedure Laterality Date   urinary tract surgery     VAGINAL HYSTERECTOMY N/A 10/30/2020   Procedure: HYSTERECTOMY VAGINAL;  Surgeon: Janyth Pupa, DO;  Location: [facility];  Service: Gynecology;  Laterality: N/A;    OB History     Gravida  2   Para  2   Term  2   Preterm      AB      Living  2      SAB      IAB      Ectopic      Multiple      Live Births  2           Social History   Socioeconomic History   Marital status: Divorced    Spouse name: Not on file   Number of children: Not on file   Years of education: Not on file   Highest education level: Not on file  Occupational History   Not on file  Tobacco Use   Smoking status: Never   Smokeless tobacco: Never  Vaping Use   Vaping Use: Never used  Substance and Sexual Activity   Alcohol use: No   Drug use: No   Sexual activity: Not Currently    Birth control/protection: Surgical    Comment: hyst  Other Topics Concern   Not on file  Social History Narrative   Not on file   Social Determinants of Health   Financial Resource Strain: Low Risk  (11/18/2021)   Overall Financial Resource Strain (CARDIA)    Difficulty of Paying Living Expenses: Not hard at all  Food Insecurity: No Food Insecurity (11/18/2021)   Hunger Vital Sign    Worried About Running Out of Food in the Last Year: Never true    [place] in the Last Year: Never true  Transportation Needs: No  Transportation Needs (11/18/2021)   PRAPARE - Hydrologist (Medical): No    Lack of Transportation (Non-Medical): No  Physical Activity: Sufficiently Active (11/18/2021)   Exercise Vital Sign    Days of Exercise per Week: 5 days    Minutes of Exercise per Session: 100 min  Stress: No Stress Concern Present (11/18/2021)   [place]    Feeling of Stress : Only a little  Social Connections: Moderately Integrated (11/18/2021)   Social Connection and Isolation Panel [NHANES]    Frequency of Communication with Friends and Family: More than three times a week    Frequency of Social Gatherings with Friends and Family: Once a week    Attends Religious Services: More than 4 times per year    Active Member of [employer] or Organizations: Yes    Attends Archivist Meetings: 1 to 4 times per year    Marital Status: Divorced    Family History  Problem Relation Age of Onset   Diabetes Paternal Merchant navy officer  Colon cancer Paternal Grandmother    Breast cancer Paternal Grandmother    Diverticulitis Maternal Grandmother    Heart murmur Maternal Grandmother    COPD Father    Hypertension Father    Heart disease Father    Hypertension Mother    Thyroid disease Mother    Heart murmur Mother      Current Outpatient Medications:    estradiol (ESTRACE) 1 MG tablet, Take 1 tablet (1 mg total) by mouth daily for 360 doses., Disp: 90 tablet, Rfl: 3   Multiple Vitamins-Minerals (MULTIVITAMIN WITH MINERALS) tablet, Take 1 tablet by mouth daily. With Iron (Patient not taking: Reported on 11/18/2021), Disp: , Rfl:   Current Facility-Administered Medications:    ibuprofen (ADVIL) tablet 600 mg, 600 mg, Oral, Q6H PRN, Myna Hidalgo, DO  Review of Systems  Review of Systems  Constitutional: Negative for fever, chills, weight loss, malaise/fatigue and diaphoresis.  HENT: Negative for hearing loss, ear pain,  nosebleeds, congestion, sore throat, neck pain, tinnitus and ear discharge.   Eyes: Negative for blurred vision, double vision, photophobia, pain, discharge and redness.  Respiratory: Negative for cough, hemoptysis, sputum production, shortness of breath, wheezing and stridor.   Cardiovascular: Negative for chest pain, palpitations, orthopnea, claudication, leg swelling and PND.  Gastrointestinal: negative for abdominal pain. Negative for heartburn, nausea, vomiting, diarrhea, constipation, blood in stool and melena.  Genitourinary: Negative for dysuria, urgency, frequency, hematuria and flank pain.  Musculoskeletal: Negative for myalgias, back pain, joint pain and falls.  Skin: Negative for itching and rash.  Neurological: Negative for dizziness, tingling, tremors, sensory change, speech change, focal weakness, seizures, loss of consciousness, weakness and headaches.  Endo/Heme/Allergies: Negative for environmental allergies and polydipsia. Does not bruise/bleed easily.  Psychiatric/Behavioral: Negative for depression, suicidal ideas, hallucinations, memory loss and substance abuse. The patient is not nervous/anxious and does not have insomnia.        Objective:  Blood pressure 130/75, pulse 66, height 5\' 6"  (1.676 m), weight 136 lb (61.7 kg), last menstrual period 10/01/2020.   Physical Exam  Vitals reviewed. Constitutional: She is oriented to person, place, and time. She appears well-developed and well-nourished.  HENT:  Head: Normocephalic and atraumatic.        Right Ear: External ear normal.  Left Ear: External ear normal.  Nose: Nose normal.  Mouth/Throat: Oropharynx is clear and moist.  Eyes: Conjunctivae and EOM are normal. Pupils are equal, round, and reactive to light. Right eye exhibits no discharge. Left eye exhibits no discharge. No scleral icterus.  Neck: Normal range of motion. Neck supple. No tracheal deviation present. No thyromegaly present.  Cardiovascular: Normal rate,  regular rhythm, normal heart sounds and intact distal pulses.  Exam reveals no gallop and no friction rub.   No murmur heard. Respiratory: Effort normal and breath sounds normal. No respiratory distress. She has no wheezes. She has no rales. She exhibits no tenderness.  GI: Soft. Bowel sounds are normal. She exhibits no distension and no mass. There is no tenderness. There is no rebound and no guarding.  Genitourinary:  Breasts no masses skin changes or nipple changes bilaterally      Vulva is normal without lesions Vagina is pink moist without discharge Cervix absent Uterus is absent Adnexa is negative with normal sized ovaries   Musculoskeletal: Normal range of motion. She exhibits no edema and no tenderness.  Neurological: She is alert and oriented to person, place, and time. She has normal reflexes. She displays normal reflexes. No cranial nerve deficit. She  exhibits normal muscle tone. Coordination normal.  Skin: Skin is warm and dry. No rash noted. No erythema. No pallor.  Psychiatric: She has a normal mood and affect. Her behavior is normal. Judgment and thought content normal.       Medications Ordered at today's visit: Meds ordered this encounter  Medications   estradiol (ESTRACE) 1 MG tablet    Sig: Take 1 tablet (1 mg total) by mouth daily for 360 doses.    Dispense:  90 tablet    Refill:  3    Other orders placed at today's visit: No orders of the defined types were placed in this encounter.     Assessment:    Normal Gyn exam.    Plan:    Hormone replacement therapy: hormone replacement therapy: estrace 1 mg. Follow up in: 3 years.     No follow-ups on file.

## 2022-03-28 ENCOUNTER — Other Ambulatory Visit (HOSPITAL_COMMUNITY): Payer: Self-pay | Admitting: Obstetrics & Gynecology

## 2022-03-28 DIAGNOSIS — Z1231 Encounter for screening mammogram for malignant neoplasm of breast: Secondary | ICD-10-CM

## 2022-04-14 ENCOUNTER — Ambulatory Visit (HOSPITAL_COMMUNITY)
Admission: RE | Admit: 2022-04-14 | Discharge: 2022-04-14 | Disposition: A | Discharge status: Home / Self Care | Payer: Federal, State, Local not specified - PPO | Source: Ambulatory Visit | Attending: Obstetrics & Gynecology | Admitting: Obstetrics & Gynecology

## 2022-04-14 DIAGNOSIS — Z1231 Encounter for screening mammogram for malignant neoplasm of breast: Secondary | ICD-10-CM | POA: Diagnosis present

## 2022-12-01 ENCOUNTER — Other Ambulatory Visit: Payer: Federal, State, Local not specified - PPO

## 2022-12-02 ENCOUNTER — Encounter: Payer: Self-pay | Admitting: Obstetrics & Gynecology

## 2022-12-02 LAB — COMPREHENSIVE METABOLIC PANEL
ALT: 17 IU/L (ref 0–32)
AST: 25 IU/L (ref 0–40)
Albumin: 4.4 g/dL (ref 3.8–4.9)
Alkaline Phosphatase: 72 IU/L (ref 44–121)
BUN/Creatinine Ratio: 17 (ref 9–23)
BUN: 14 mg/dL (ref 6–24)
Bilirubin Total: 0.6 mg/dL (ref 0.0–1.2)
CO2: 24 mmol/L (ref 20–29)
Calcium: 9.3 mg/dL (ref 8.7–10.2)
Chloride: 106 mmol/L (ref 96–106)
Creatinine, Ser: 0.83 mg/dL (ref 0.57–1.00)
Globulin, Total: 2 g/dL (ref 1.5–4.5)
Glucose: 88 mg/dL (ref 70–99)
Potassium: 4.7 mmol/L (ref 3.5–5.2)
Sodium: 142 mmol/L (ref 134–144)
Total Protein: 6.4 g/dL (ref 6.0–8.5)
eGFR: 81 mL/min/{1.73_m2} (ref >59)

## 2022-12-02 LAB — LIPID PANEL
Chol/HDL Ratio: 2.9 ratio (ref 0.0–4.4)
Cholesterol, Total: 231 mg/dL — ABNORMAL HIGH (ref 100–199)
HDL: 81 mg/dL (ref >39)
LDL Chol Calc (NIH): 137 mg/dL — ABNORMAL HIGH (ref 0–99)
Triglycerides: 73 mg/dL (ref 0–149)
VLDL Cholesterol Cal: 13 mg/dL (ref 5–40)

## 2022-12-02 LAB — CBC
Hematocrit: 44.9 % (ref 34.0–46.6)
Hemoglobin: 15.4 g/dL (ref 11.1–15.9)
MCH: 31.3 pg (ref 26.6–33.0)
MCHC: 34.3 g/dL (ref 31.5–35.7)
MCV: 91 fL (ref 79–97)
Platelets: 184 10*3/uL (ref 150–450)
RBC: 4.92 x10E6/uL (ref 3.77–5.28)
RDW: 13.1 % (ref 11.7–15.4)
WBC: 3.8 10*3/uL (ref 3.4–10.8)

## 2022-12-02 LAB — TSH: TSH: 3.93 u[IU]/mL (ref 0.450–4.500)

## 2022-12-02 LAB — HEMOGLOBIN A1C
Est. average glucose Bld gHb Est-mCnc: 105 mg/dL
Hgb A1c MFr Bld: 5.3 % (ref 4.8–5.6)

## 2022-12-02 LAB — VITAMIN D 25 HYDROXY (VIT D DEFICIENCY, FRACTURES): Vit D, 25-Hydroxy: 42.9 ng/mL (ref 30.0–100.0)

## 2022-12-08 ENCOUNTER — Encounter: Payer: Self-pay | Admitting: Obstetrics & Gynecology

## 2022-12-08 ENCOUNTER — Ambulatory Visit (INDEPENDENT_AMBULATORY_CARE_PROVIDER_SITE_OTHER): Payer: Federal, State, Local not specified - PPO | Admitting: Obstetrics & Gynecology

## 2022-12-08 DIAGNOSIS — N951 Menopausal and female climacteric states: Secondary | ICD-10-CM | POA: Diagnosis not present

## 2022-12-08 DIAGNOSIS — Z79890 Hormone replacement therapy (postmenopausal): Secondary | ICD-10-CM

## 2022-12-08 MED ORDER — ESTRADIOL 1 MG PO TABS: 1.0000 mg | ORAL_TABLET | Freq: Every day | ORAL | 3 refills | Status: DC

## 2022-12-08 NOTE — Progress Notes (Signed)
Subjective:     Emily Mata is a 62 y.o. female here for a routine exam.  Patient's last menstrual period was 10/01/2020 (exact date). [id number] Birth Control Method:  hysterectomy Menstrual Calendar(currently):   Current complaints: .   Current acute medical issues:  ^lipids on supplements   Recent Gynecologic History Patient's last menstrual period was 10/01/2020 (exact date). Last Pap: na,  hysterectomy Last mammogram: 12/23,  normal  Past Medical History:  Diagnosis Date   Anemia    Iron deficiency     Past Surgical History:  Procedure Laterality Date   urinary tract surgery     VAGINAL HYSTERECTOMY N/A 10/30/2020   Procedure: HYSTERECTOMY VAGINAL;  Surgeon: Myna Hidalgo, DO;  Location: [facility];  Service: Gynecology;  Laterality: N/A;    OB History     Gravida  2   Para  2   Term  2   Preterm      AB      Living  2      SAB      IAB      Ectopic      Multiple      Live Births  2           Social History   Socioeconomic History   Marital status: Divorced    Spouse name: Not on file   Number of children: Not on file   Years of education: Not on file   Highest education level: Not on file  Occupational History   Not on file  Tobacco Use   Smoking status: Never   Smokeless tobacco: Never  Vaping Use   Vaping status: Never Used  Substance and Sexual Activity   Alcohol use: No   Drug use: No   Sexual activity: Not Currently    Birth control/protection: Surgical    Comment: hyst  Other Topics Concern   Not on file  Social History Narrative   Not on file   Social Determinants of Health   Financial Resource Strain: Low Risk  (11/18/2021)   Overall Financial Resource Strain (CARDIA)    Difficulty of Paying Living Expenses: Not hard at all  Food Insecurity: No Food Insecurity (11/18/2021)   Hunger Vital Sign    Worried About Running Out of Food in the Last Year: Never true    Ran Out of Food in the Last Year: Never true   Transportation Needs: No Transportation Needs (11/18/2021)   PRAPARE - Administrator, Civil Service (Medical): No    Lack of Transportation (Non-Medical): No  Physical Activity: Sufficiently Active (11/18/2021)   Exercise Vital Sign    Days of Exercise per Week: 5 days    Minutes of Exercise per Session: 100 min  Stress: No Stress Concern Present (11/18/2021)   [employer] of Occupational Health - Occupational Stress Questionnaire    Feeling of Stress : Only a little  Social Connections: Moderately Integrated (11/18/2021)   Social Connection and Isolation Panel [NHANES]    Frequency of Communication with Friends and Family: More than three times a week    Frequency of Social Gatherings with Friends and Family: Once a week    Attends Religious Services: More than 4 times per year    Active Member of [employer] or Organizations: Yes    Attends Banker Meetings: 1 to 4 times per year    Marital Status: Divorced    Family History  Problem Relation Age of Onset   Diabetes Paternal  Grandfather    Colon cancer Paternal Grandmother    Breast cancer Paternal Grandmother    Diverticulitis Maternal Grandmother    Heart murmur Maternal Grandmother    COPD Father    Hypertension Father    Heart disease Father    Hypertension Mother    Thyroid disease Mother    Heart murmur Mother      Current Outpatient Medications:    RED YEAST RICE EXTRACT PO, Take by mouth., Disp: , Rfl:    estradiol (ESTRACE) 1 MG tablet, Take 1 tablet (1 mg total) by mouth daily for 360 doses., Disp: 90 tablet, Rfl: 3   Multiple Vitamins-Minerals (MULTIVITAMIN WITH MINERALS) tablet, Take 1 tablet by mouth daily. With Iron (Patient not taking: Reported on 11/18/2021), Disp: , Rfl:   Current Facility-Administered Medications:    ibuprofen (ADVIL) tablet 600 mg, 600 mg, Oral, Q6H PRN, Myna Hidalgo, DO  Review of Systems  Review of Systems  Constitutional: Negative for fever, chills,  weight loss, malaise/fatigue and diaphoresis.  HENT: Negative for hearing loss, ear pain, nosebleeds, congestion, sore throat, neck pain, tinnitus and ear discharge.   Eyes: Negative for blurred vision, double vision, photophobia, pain, discharge and redness.  Respiratory: Negative for cough, hemoptysis, sputum production, shortness of breath, wheezing and stridor.   Cardiovascular: Negative for chest pain, palpitations, orthopnea, claudication, leg swelling and PND.  Gastrointestinal: negative for abdominal pain. Negative for heartburn, nausea, vomiting, diarrhea, constipation, blood in stool and melena.  Genitourinary: Negative for dysuria, urgency, frequency, hematuria and flank pain.  Musculoskeletal: Negative for myalgias, back pain, joint pain and falls.  Skin: Negative for itching and rash.  Neurological: Negative for dizziness, tingling, tremors, sensory change, speech change, focal weakness, seizures, loss of consciousness, weakness and headaches.  Endo/Heme/Allergies: Negative for environmental allergies and polydipsia. Does not bruise/bleed easily.  Psychiatric/Behavioral: Negative for depression, suicidal ideas, hallucinations, memory loss and substance abuse. The patient is not nervous/anxious and does not have insomnia.        Objective:  Blood pressure 129/72, pulse (!) 57, height 5\' 6"  (1.676 m), weight 140 lb (63.5 kg), last menstrual period 10/01/2020.   Physical Exam  Vitals reviewed. Constitutional: She is oriented to person, place, and time. She appears well-developed and well-nourished.  HENT:  Head: Normocephalic and atraumatic.        Right Ear: External ear normal.  Left Ear: External ear normal.  Nose: Nose normal.  Mouth/Throat: Oropharynx is clear and moist.  Eyes: Conjunctivae and EOM are normal. Pupils are equal, round, and reactive to light. Right eye exhibits no discharge. Left eye exhibits no discharge. No scleral icterus.  Neck: Normal range of motion.  Neck supple. No tracheal deviation present. No thyromegaly present.  Cardiovascular: Normal rate, regular rhythm, normal heart sounds and intact distal pulses.  Exam reveals no gallop and no friction rub.   No murmur heard. Respiratory: Effort normal and breath sounds normal. No respiratory distress. She has no wheezes. She has no rales. She exhibits no tenderness.  GI: Soft. Bowel sounds are normal. She exhibits no distension and no mass. There is no tenderness. There is no rebound and no guarding.  Genitourinary:  Breasts no masses skin changes or nipple changes bilaterally      Vulva is normal without lesions Vagina is pink moist without discharge Cervix absent Uterus is absent Adnexa is negative   Musculoskeletal: Normal range of motion. She exhibits no edema and no tenderness.  Neurological: She is alert and oriented to person, place,  and time. She has normal reflexes. She displays normal reflexes. No cranial nerve deficit. She exhibits normal muscle tone. Coordination normal.  Skin: Skin is warm and dry. No rash noted. No erythema. No pallor.  Psychiatric: She has a normal mood and affect. Her behavior is normal. Judgment and thought content normal.       Medications Ordered at today's visit: Meds ordered this encounter  Medications   estradiol (ESTRACE) 1 MG tablet    Sig: Take 1 tablet (1 mg total) by mouth daily for 360 doses.    Dispense:  90 tablet    Refill:  3    Other orders placed at today's visit: No orders of the defined types were placed in this encounter.     Assessment:    Normal Gyn exam.   ^li[pids-->red yeast rice + niacin Plan:    3 years for yearly Recheck lipids in 1 year     Return in about 3 years (around 12/07/2025).

## 2023-02-08 IMAGING — MG MM DIGITAL SCREENING BILAT W/ TOMO AND CAD
6 of 10 series · 6 of 30 positions shown · non-contrast
Comparison: Previous exam(s).

CLINICAL DATA: Screening.

EXAM:
DIGITAL SCREENING BILATERAL MAMMOGRAM WITH TOMOSYNTHESIS AND CAD
TECHNIQUE: Bilateral screening digital craniocaudal and mediolateral oblique
mammograms were obtained. Bilateral screening digital breast
tomosynthesis was performed. The images were evaluated with
computer-aided detection.

[R CC synth-2D]
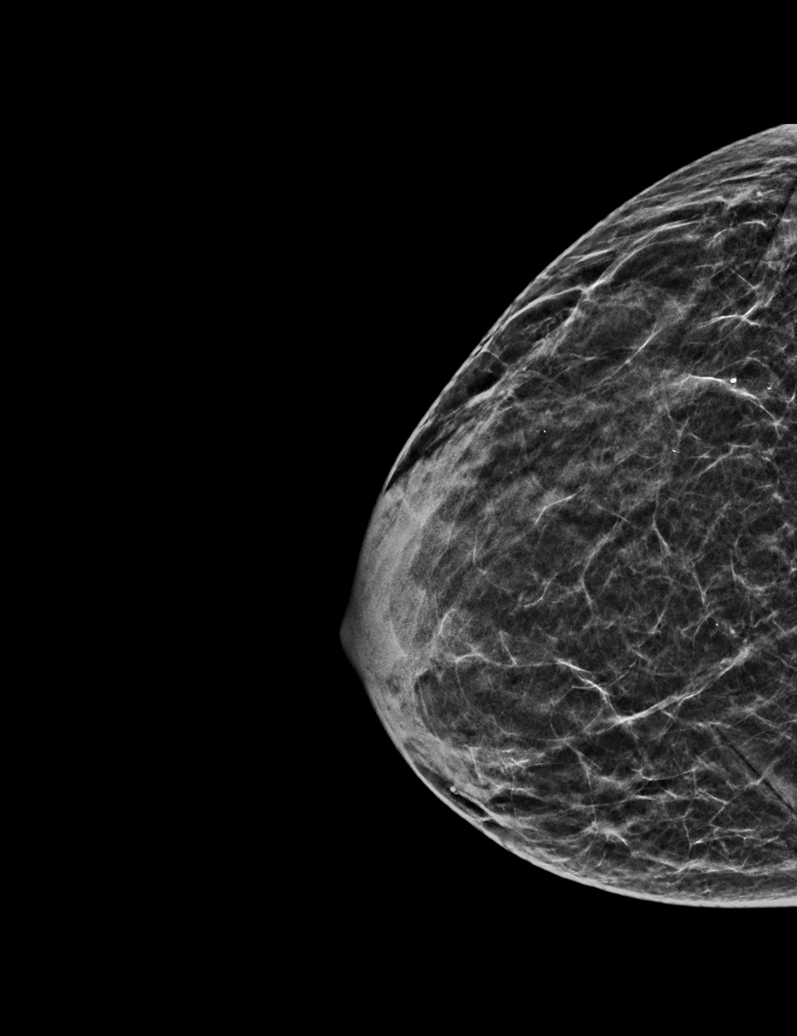

[R MLO synth-2D (1 of 2)]
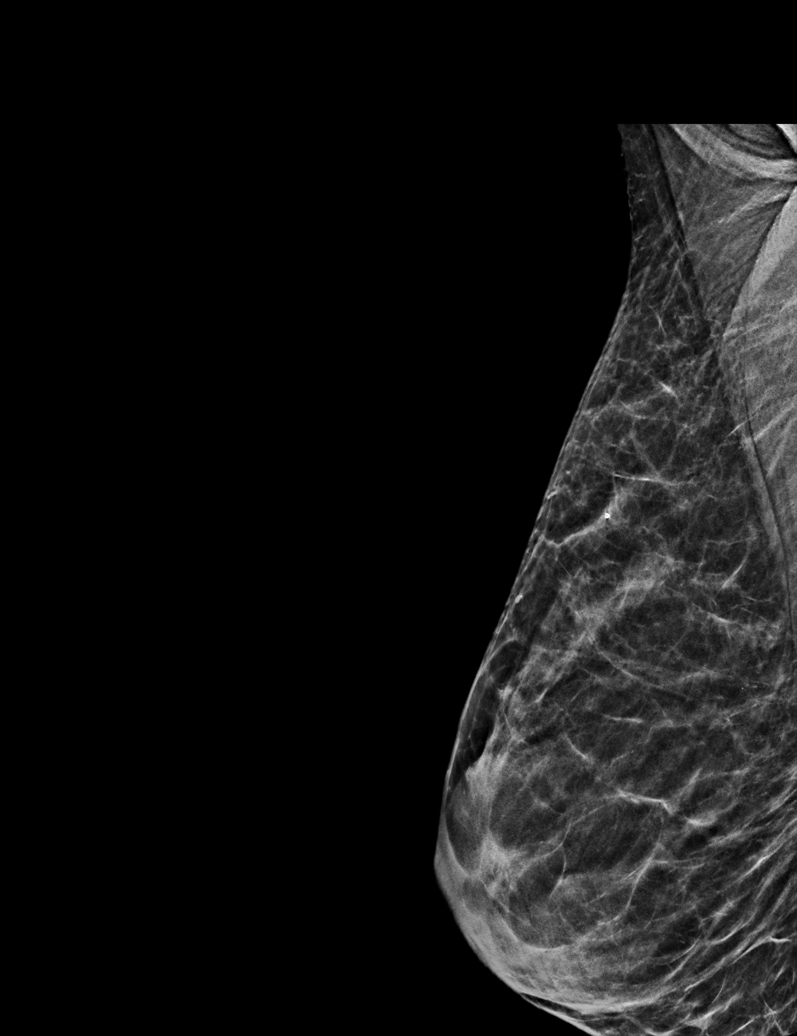

[L MLO synth-2D]
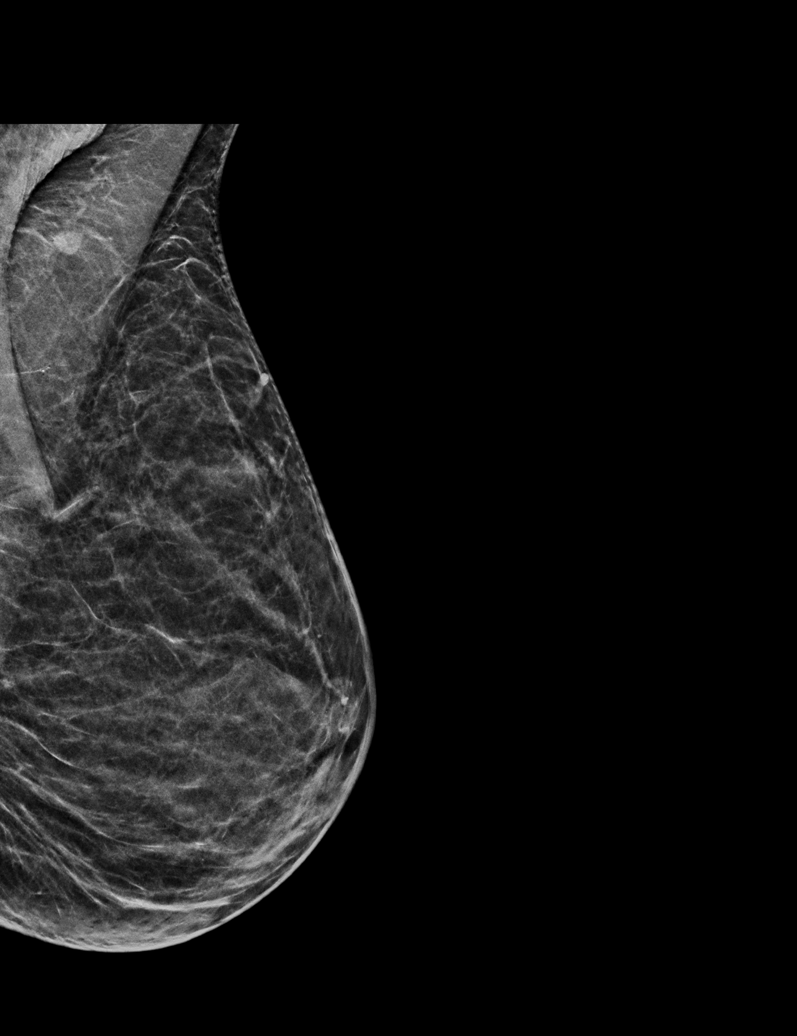

[L CC synth-2D]
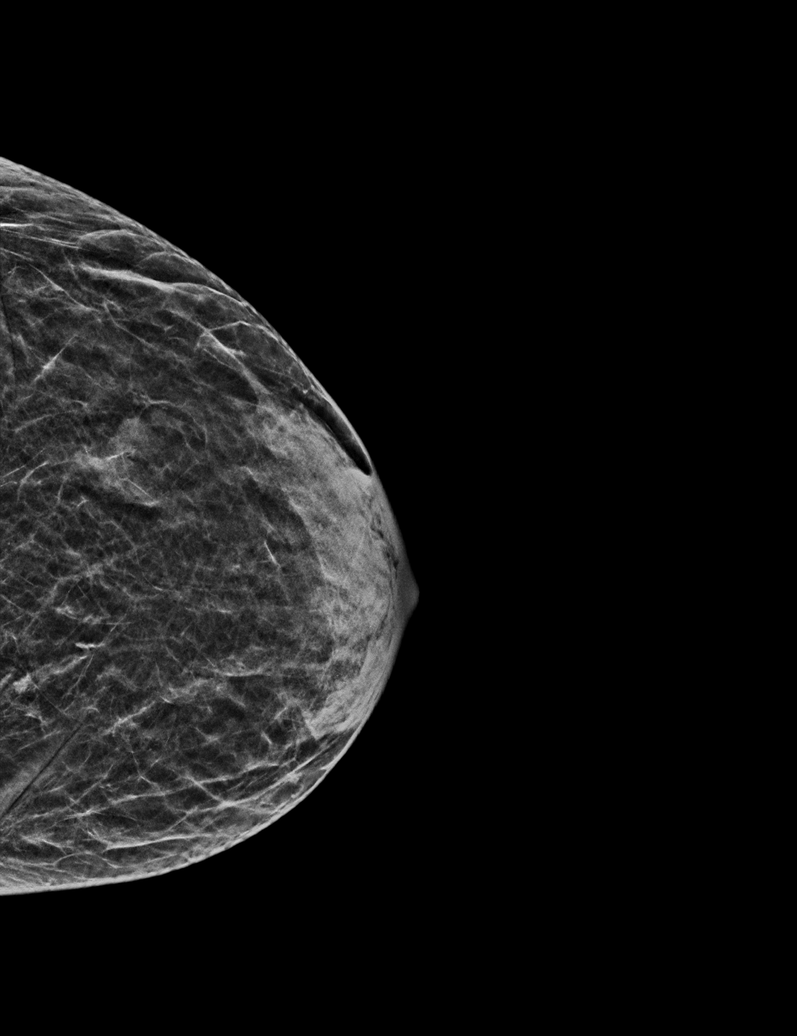

[R MLO synth-2D (2 of 2)]
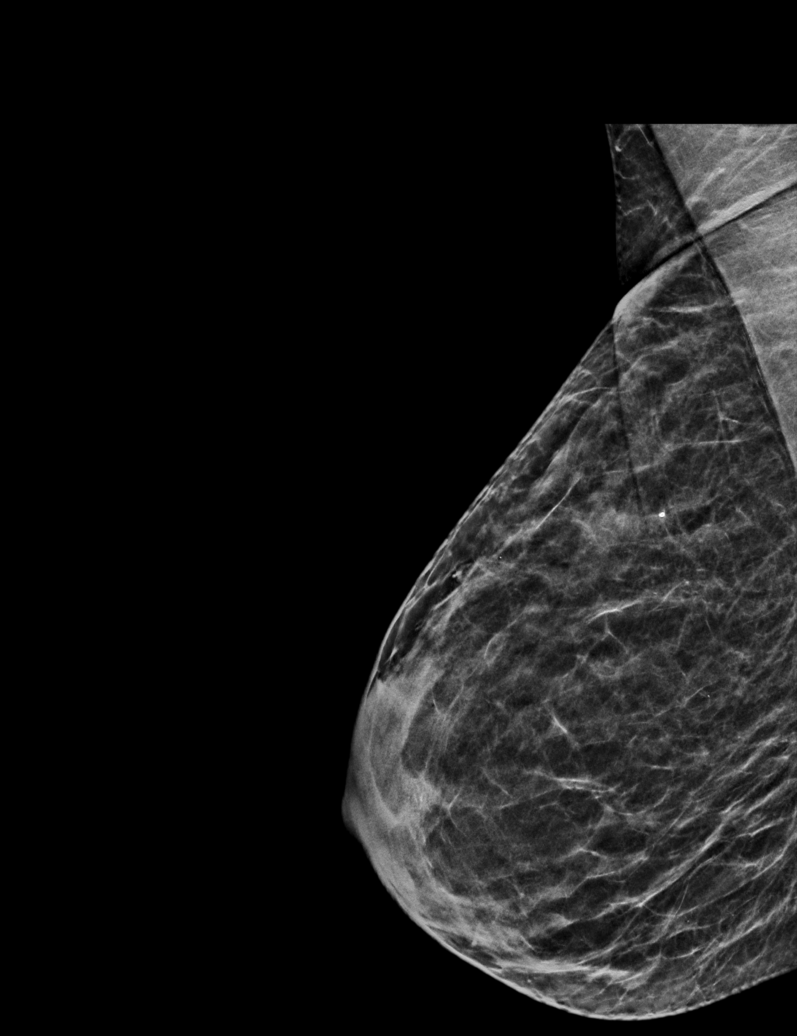

[L MLO tomo · tomo slice 21/41.0]
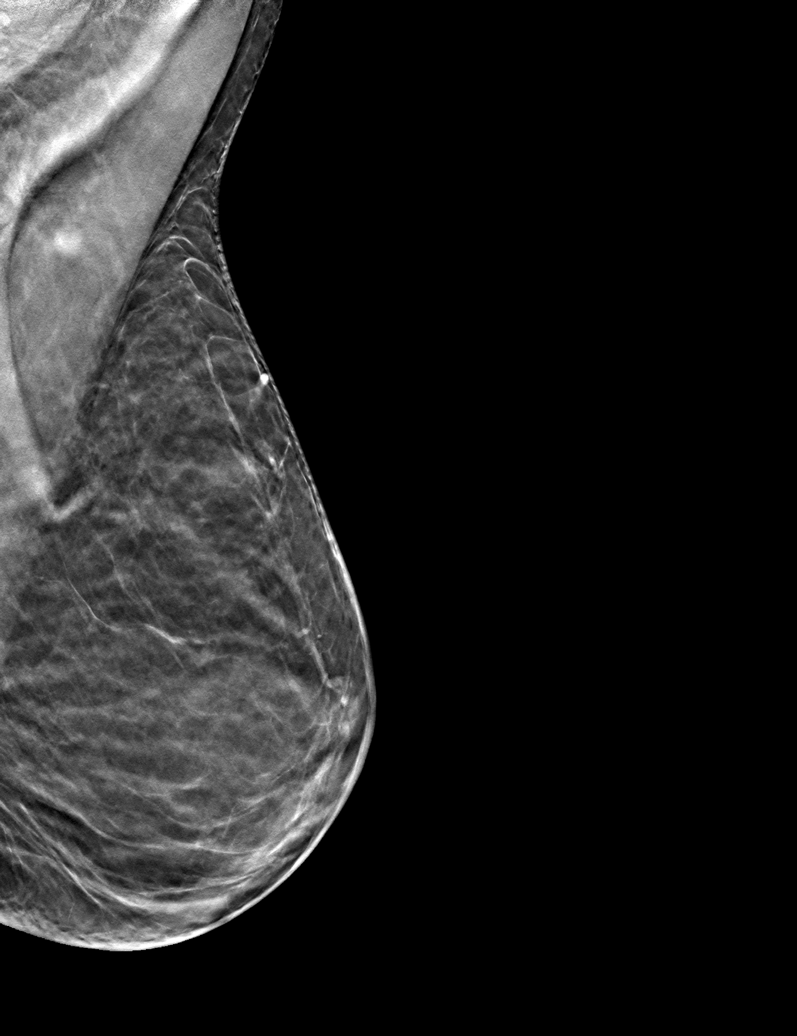

[6 of 30 positions shown; findings below may reference images not displayed]

ACR Breast Density Category c: The breast tissue is heterogeneously
dense, which may obscure small masses.
FINDINGS: There are no findings suspicious for malignancy.
IMPRESSION: No mammographic evidence of malignancy. A result letter of this
screening mammogram will be mailed directly to the patient.

RECOMMENDATION:
Screening mammogram in one year. (Code:[id number])

BI-RADS CATEGORY  1: Negative.

## 2023-06-05 ENCOUNTER — Other Ambulatory Visit (HOSPITAL_COMMUNITY): Payer: Self-pay | Admitting: Family Medicine

## 2023-06-05 DIAGNOSIS — Z1231 Encounter for screening mammogram for malignant neoplasm of breast: Secondary | ICD-10-CM

## 2023-07-06 ENCOUNTER — Ambulatory Visit (HOSPITAL_COMMUNITY)
Admission: RE | Admit: 2023-07-06 | Discharge: 2023-07-06 | Disposition: A | Discharge status: Home / Self Care | Payer: Federal, State, Local not specified - PPO | Source: Ambulatory Visit | Attending: Family Medicine | Admitting: Family Medicine

## 2023-07-06 DIAGNOSIS — Z1231 Encounter for screening mammogram for malignant neoplasm of breast: Secondary | ICD-10-CM | POA: Diagnosis present

## 2023-12-02 ENCOUNTER — Other Ambulatory Visit: Payer: Self-pay | Admitting: Obstetrics & Gynecology

## 2023-12-02 DIAGNOSIS — Z79890 Hormone replacement therapy (postmenopausal): Secondary | ICD-10-CM

## 2023-12-02 DIAGNOSIS — N951 Menopausal and female climacteric states: Secondary | ICD-10-CM

## 2024-01-14 ENCOUNTER — Ambulatory Visit: Admitting: Obstetrics & Gynecology

## 2024-01-14 ENCOUNTER — Encounter: Payer: Self-pay | Admitting: Obstetrics & Gynecology

## 2024-01-14 VITALS — BP 134/72 | HR 58 | Ht 66.0 in | Wt 131.0 lb

## 2024-01-14 DIAGNOSIS — Z79890 Hormone replacement therapy (postmenopausal): Secondary | ICD-10-CM

## 2024-01-14 DIAGNOSIS — Z90710 Acquired absence of both cervix and uterus: Secondary | ICD-10-CM | POA: Diagnosis not present

## 2024-01-14 DIAGNOSIS — Z01419 Encounter for gynecological examination (general) (routine) without abnormal findings: Secondary | ICD-10-CM | POA: Diagnosis not present

## 2024-01-14 DIAGNOSIS — N951 Menopausal and female climacteric states: Secondary | ICD-10-CM | POA: Diagnosis not present

## 2024-01-14 DIAGNOSIS — Z1231 Encounter for screening mammogram for malignant neoplasm of breast: Secondary | ICD-10-CM

## 2024-01-14 MED ORDER — ESTRADIOL 1 MG PO TABS: 1.0000 mg | ORAL_TABLET | Freq: Every day | ORAL | 3 refills | Status: AC

## 2024-01-14 NOTE — Progress Notes (Signed)
 Subjective:     Emily Mata is a 63 y.o. female here for a routine exam.  Patient's last menstrual period was 10/01/2020 (exact date). [id number] Birth Control Method:  hysterectomy Menstrual Calendar(currently): amenorrhea  Current complaints: none.   Current acute medical issues:  none   Recent Gynecologic History Patient's last menstrual period was 10/01/2020 (exact date). Last Pap: hysterectomy,   Last mammogram: 07/2020,  normal-->will order   Past Medical History:  Diagnosis Date   Anemia    Iron deficiency     Past Surgical History:  Procedure Laterality Date   urinary tract surgery     VAGINAL HYSTERECTOMY N/A 10/30/2020   Procedure: HYSTERECTOMY VAGINAL;  Surgeon: Ozan, Jennifer, DO;  Location: [facility];  Service: Gynecology;  Laterality: N/A;    OB History     Gravida  2   Para  2   Term  2   Preterm      AB      Living  2      SAB      IAB      Ectopic      Multiple      Live Births  2           Social History   Socioeconomic History   Marital status: Divorced    Spouse name: Not on file   Number of children: Not on file   Years of education: Not on file   Highest education level: Not on file  Occupational History   Not on file  Tobacco Use   Smoking status: Never   Smokeless tobacco: Never  Vaping Use   Vaping status: Never Used  Substance and Sexual Activity   Alcohol use: No   Drug use: No   Sexual activity: Not Currently    Birth control/protection: Surgical    Comment: hyst  Other Topics Concern   Not on file  Social History Narrative   Not on file   Social Drivers of Health   Financial Resource Strain: Low Risk  (01/14/2024)   Overall Financial Resource Strain (CARDIA)    Difficulty of Paying Living Expenses: Not hard at all  Food Insecurity: No Food Insecurity (01/14/2024)   Hunger Vital Sign    Worried About Running Out of Food in the Last Year: Never true    Ran Out of Food in the Last Year: Never true   Transportation Needs: No Transportation Needs (01/14/2024)   PRAPARE - Administrator, Civil Service (Medical): No    Lack of Transportation (Non-Medical): No  Physical Activity: Insufficiently Active (01/14/2024)   Exercise Vital Sign    Days of Exercise per Week: 4 days    Minutes of Exercise per Session: 20 min  Stress: No Stress Concern Present (01/14/2024)   [employer] of Occupational Health - Occupational Stress Questionnaire    Feeling of Stress: Not at all  Social Connections: Moderately Integrated (01/14/2024)   Social Connection and Isolation Panel    Frequency of Communication with Friends and Family: More than three times a week    Frequency of Social Gatherings with Friends and Family: Twice a week    Attends Religious Services: More than 4 times per year    Active Member of [employer] or Organizations: Yes    Attends Engineer, structural: More than 4 times per year    Marital Status: Divorced    Family History  Problem Relation Age of Onset   Diabetes Paternal Actor  Colon cancer Paternal Grandmother    Breast cancer Paternal Grandmother    Diverticulitis Maternal Grandmother    Heart murmur Maternal Grandmother    COPD Father    Hypertension Father    Heart disease Father    Hypertension Mother    Thyroid disease Mother    Heart murmur Mother      Current Outpatient Medications:    Multiple Vitamins-Minerals (MULTIVITAMIN WITH MINERALS) tablet, Take 1 tablet by mouth daily. With Iron, Disp: , Rfl:    estradiol  (ESTRACE ) 1 MG tablet, Take 1 tablet (1 mg total) by mouth daily for 360 doses., Disp: 90 tablet, Rfl: 3   RED YEAST RICE EXTRACT PO, Take by mouth. (Patient not taking: Reported on 01/14/2024), Disp: , Rfl:   Current Facility-Administered Medications:    ibuprofen  (ADVIL ) tablet 600 mg, 600 mg, Oral, Q6H PRN, Ozan, Jennifer, DO  Review of Systems  Review of Systems  Constitutional: Negative for fever, chills, weight loss,  malaise/fatigue and diaphoresis.  HENT: Negative for hearing loss, ear pain, nosebleeds, congestion, sore throat, neck pain, tinnitus and ear discharge.   Eyes: Negative for blurred vision, double vision, photophobia, pain, discharge and redness.  Respiratory: Negative for cough, hemoptysis, sputum production, shortness of breath, wheezing and stridor.   Cardiovascular: Negative for chest pain, palpitations, orthopnea, claudication, leg swelling and PND.  Gastrointestinal: negative for abdominal pain. Negative for heartburn, nausea, vomiting, diarrhea, constipation, blood in stool and melena.  Genitourinary: Negative for dysuria, urgency, frequency, hematuria and flank pain.  Musculoskeletal: Negative for myalgias, back pain, joint pain and falls.  Skin: Negative for itching and rash.  Neurological: Negative for dizziness, tingling, tremors, sensory change, speech change, focal weakness, seizures, loss of consciousness, weakness and headaches.  Endo/Heme/Allergies: Negative for environmental allergies and polydipsia. Does not bruise/bleed easily.  Psychiatric/Behavioral: Negative for depression, suicidal ideas, hallucinations, memory loss and substance abuse. The patient is not nervous/anxious and does not have insomnia.        Objective:  Blood pressure 134/72, pulse (!) 58, height 5' 6 (1.676 m), weight 131 lb (59.4 kg), last menstrual period 10/01/2020.   Physical Exam  Vitals reviewed. Constitutional: She is oriented to person, place, and time. She appears well-developed and well-nourished.  HENT:  Head: Normocephalic and atraumatic.        Right Ear: External ear normal.  Left Ear: External ear normal.  Nose: Nose normal.  Mouth/Throat: Oropharynx is clear and moist.  Eyes: Conjunctivae and EOM are normal. Pupils are equal, round, and reactive to light. Right eye exhibits no discharge. Left eye exhibits no discharge. No scleral icterus.  Neck: Normal range of motion. Neck supple. No  tracheal deviation present. No thyromegaly present.  Cardiovascular: Normal rate, regular rhythm, normal heart sounds and intact distal pulses.  Exam reveals no gallop and no friction rub.   No murmur heard. Respiratory: Effort normal and breath sounds normal. No respiratory distress. She has no wheezes. She has no rales. She exhibits no tenderness.  GI: Soft. Bowel sounds are normal. She exhibits no distension and no mass. There is no tenderness. There is no rebound and no guarding.  Genitourinary:  Breasts no masses skin changes or nipple changes bilaterally      Vulva is normal without lesions Vagina is pink moist without discharge, good estrogenic effect Cervix surgiocally absent Uterus is surgically absent Adnexa is negative   Musculoskeletal: Normal range of motion. She exhibits no edema and no tenderness.  Neurological: She is alert and oriented to person,  place, and time. She has normal reflexes. She displays normal reflexes. No cranial nerve deficit. She exhibits normal muscle tone. Coordination normal.  Skin: Skin is warm and dry. No rash noted. No erythema. No pallor.  Psychiatric: She has a normal mood and affect. Her behavior is normal. Judgment and thought content normal.       Medications Ordered at today's visit: Meds ordered this encounter  Medications   estradiol  (ESTRACE ) 1 MG tablet    Sig: Take 1 tablet (1 mg total) by mouth daily for 360 doses.    Dispense:  90 tablet    Refill:  3    Other orders placed at today's visit: Orders Placed This Encounter  Procedures   MM 3D DIAGNOSTIC MAMMOGRAM BILATERAL BREAST     ASSESSMENT + PLAN:    ICD-10-CM   1. Well woman exam with routine gynecological exam  Z01.419     2. Breast cancer screening by mammogram  Z12.31 MM 3D DIAGNOSTIC MAMMOGRAM BILATERAL BREAST    3. Hormone replacement therapy (HRT)  Z79.890 estradiol  (ESTRACE ) 1 MG tablet    4. Vasomotor symptoms due to menopause  N95.1 estradiol  (ESTRACE ) 1 MG  tablet          Return in about 3 years (around 01/14/2027) for yearly.

## 2024-01-20 ENCOUNTER — Ambulatory Visit: Admitting: Orthopedic Surgery

## 2024-01-20 ENCOUNTER — Other Ambulatory Visit (INDEPENDENT_AMBULATORY_CARE_PROVIDER_SITE_OTHER): Payer: Self-pay

## 2024-01-20 ENCOUNTER — Encounter: Payer: Self-pay | Admitting: Orthopedic Surgery

## 2024-01-20 VITALS — BP 138/77 | HR 68 | Ht 66.0 in | Wt 131.0 lb

## 2024-01-20 DIAGNOSIS — M7582 Other shoulder lesions, left shoulder: Secondary | ICD-10-CM | POA: Diagnosis not present

## 2024-01-20 DIAGNOSIS — M25512 Pain in left shoulder: Secondary | ICD-10-CM

## 2024-01-20 NOTE — Progress Notes (Signed)
 New Patient Visit  Assessment: Emily Mata is a 63 y.o. female with the following: 1. Tendinitis of left rotator cuff  Plan: Emily Mata has pain in the left shoulder.  She is left-hand dominant.  Atraumatic onset.  She does work as a Museum/gallery curator.  Intermittent use of NSAIDs.  Radiographs without glenohumeral pathology.  Some mild to moderate degenerative changes of the [facility] joint, with history of AC joint separation.  We discussed multiple treatment options, and she is interested in an injection.  Procedure note injection Left shoulder    Verbal consent was obtained to inject the left shoulder, subacromial space Timeout was completed to confirm the site of injection.  The skin was prepped with alcohol and ethyl chloride was sprayed at the injection site.  A 21-gauge needle was used to inject 40 mg of Depo-Medrol and 1% lidocaine  (4 cc) into the subacromial space of the left shoulder using a posterolateral approach.  There were no complications. A sterile bandage was applied.   Follow-up: Return in about 6 weeks (around 03/02/2024).  Subjective:  Chief Complaint  Patient presents with   Shoulder Pain    L for 3 mos. States she woke up one morning in June and couldn't lift her arm. Now she's gained ROM but pain is still there and getting worse.     History of Present Illness: Emily Mata is a 63 y.o. female who presents for evaluation of left shoulder pain.  She is left-hand dominant.  She states that she started to have pain in the left shoulder about 3 months ago.  She works as a Museum/gallery curator.  No specific injury.  She woke up 1 morning, started to have a lot of pain, decreased motion.  Since then, the pain has gotten worse.  She has recovered some of her motion.  She has been taking ibuprofen  occasionally.  No injections.  No therapy.   Review of Systems: No fevers or chills No numbness or tingling No chest pain No shortness of breath No bowel or bladder dysfunction No  GI distress No headaches   Medical History:  Past Medical History:  Diagnosis Date   Anemia    Iron deficiency     Past Surgical History:  Procedure Laterality Date   urinary tract surgery     VAGINAL HYSTERECTOMY N/A 10/30/2020   Procedure: HYSTERECTOMY VAGINAL;  Surgeon: Ozan, Jennifer, DO;  Location: [facility];  Service: Gynecology;  Laterality: N/A;    Family History  Problem Relation Age of Onset   Diabetes Paternal Grandfather    Colon cancer Paternal Grandmother    Breast cancer Paternal Grandmother    Diverticulitis Maternal Grandmother    Heart murmur Maternal Grandmother    COPD Father    Hypertension Father    Heart disease Father    Hypertension Mother    Thyroid disease Mother    Heart murmur Mother    Social History   Tobacco Use   Smoking status: Never   Smokeless tobacco: Never  Vaping Use   Vaping status: Never Used  Substance Use Topics   Alcohol use: No   Drug use: No    No Known Allergies  Current Meds  Medication Sig   estradiol  (ESTRACE ) 1 MG tablet Take 1 tablet (1 mg total) by mouth daily for 360 doses.   Multiple Vitamins-Minerals (MULTIVITAMIN WITH MINERALS) tablet Take 1 tablet by mouth daily. With Iron   Current Facility-Administered Medications for the 01/20/24 encounter (Office Visit) with  Onesimo Oneil LABOR, MD  Medication   ibuprofen  (ADVIL ) tablet 600 mg    Objective: BP 138/77   Pulse 68   Ht 5' 6 (1.676 m)   Wt 131 lb (59.4 kg)   LMP 10/01/2020 (Exact Date)   BMI 21.14 kg/m   Physical Exam:  General: Alert and oriented. and No acute distress. Gait: Normal gait.  Left shoulder without deformity.  She has full range of motion, with some obvious discomfort.  She is lacking some strength, primarily due to pain.  External rotation at her side is consistent with the contralateral side.  Fingers warm and well-perfused.  Sensation is intact throughout the left upper extremity.  IMAGING: I personally ordered and reviewed the  following images   X-rays of the left shoulder were obtained in clinic today.  No acute injuries are noted.  Slight elevation of the clavicle in relation to the acromion, with associated degenerative changes at the [facility] joint.  Well-maintained glenohumeral joint space.  No evidence of proximal humeral migration.  There is some calcification noted at the footprint of the rotator cuff.  No bony lesions.  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     Oneil LABOR Onesimo, MD  01/20/2024 8:53 AM

## 2024-01-20 NOTE — Patient Instructions (Signed)

## 2024-03-02 ENCOUNTER — Ambulatory Visit: Admitting: Orthopedic Surgery

## 2024-03-09 ENCOUNTER — Ambulatory Visit: Admitting: Orthopedic Surgery

## 2024-03-09 ENCOUNTER — Encounter: Payer: Self-pay | Admitting: Orthopedic Surgery

## 2024-03-09 VITALS — BP 131/84 | HR 71 | Ht 66.0 in

## 2024-03-09 DIAGNOSIS — M7582 Other shoulder lesions, left shoulder: Secondary | ICD-10-CM

## 2024-03-09 NOTE — Progress Notes (Signed)
 Return patient Visit  Assessment: Emily Mata is a 63 y.o. female with the following: 1. Tendinitis of left rotator cuff  Plan: Lonell DELENA Dubin continues to have pain in the left shoulder.  Steroid injection provided some improvement of her symptoms.  She is not complaining of pain over the posterior aspect of the shoulder.  Her motion continues to be pretty good, but she has pain at extremes.  Positive Jobes.  Signs of impingement.  4+/5 strength in the supraspinatus and infraspinatus.  She remains active, and does a lot of lifting at work but her pain is limiting her ability to complete her tasks at work.  Focused exercises have not improved her symptoms.  At this point, I recommended an MRI.  Once the MRI is complete, she will return to clinic to discuss the findings.  Follow-up: Return for After MRI.  Subjective:  Chief Complaint  Patient presents with   Shoulder Pain    Left- no throbbing anymore but still has a dull ache. Injection did help    History of Present Illness: Emily Mata is a 63 y.o. female who returns for evaluation of left shoulder pain.  She is left-hand dominant.  She has had pain in the left shoulder for the past 4-5 months.  No specific injury.  She is very active at work, and does a lot of lifting.  I saw her in clinic approximately 6 weeks ago.  Injection of the left shoulder helped, improving her pain to the point that she now has a dull ache.  She has difficulty with repetitive motion.  She has some pain with overhead motion.  She has been doing focused exercises.  She does a lot of lifting at work.  She continues take medications as needed.  Review of Systems: No fevers or chills No numbness or tingling No chest pain No shortness of breath No bowel or bladder dysfunction No GI distress No headaches    Objective: BP 131/84   Pulse 71   Ht 5' 6 (1.676 m)   LMP 10/01/2020 (Exact Date)   BMI 21.14 kg/m   Physical Exam:  General: Alert and  oriented. and No acute distress. Gait: Normal gait.  Left shoulder without deformity.  She has good range of motion, with some obvious discomfort.  4/5 strength in the supraspinatus and infraspinatus.  Positive Jobe.  Positive O'Brien's.  Sensation is intact throughout the left upper extremity..  Fingers are warm and well-perfused.  IMAGING: No new imaging obtained today      New Medications:  No orders of the defined types were placed in this encounter.     Oneil DELENA Horde, MD  03/09/2024 9:10 AM

## 2024-03-09 NOTE — Addendum Note (Signed)
 Addended by: VICENTA EMMIE HERO on: 03/09/2024 09:19 AM   Modules accepted: Orders

## 2024-03-14 ENCOUNTER — Ambulatory Visit (HOSPITAL_COMMUNITY)
Admission: RE | Admit: 2024-03-14 | Discharge: 2024-03-14 | Disposition: A | Discharge status: Home / Self Care | Source: Ambulatory Visit | Attending: Orthopedic Surgery | Admitting: Orthopedic Surgery

## 2024-03-14 DIAGNOSIS — M7582 Other shoulder lesions, left shoulder: Secondary | ICD-10-CM | POA: Insufficient documentation

## 2024-03-29 ENCOUNTER — Encounter: Payer: Self-pay | Admitting: Orthopedic Surgery

## 2024-03-29 ENCOUNTER — Ambulatory Visit: Admitting: Orthopedic Surgery

## 2024-03-29 DIAGNOSIS — M75112 Incomplete rotator cuff tear or rupture of left shoulder, not specified as traumatic: Secondary | ICD-10-CM | POA: Diagnosis not present

## 2024-03-29 NOTE — Patient Instructions (Signed)
 We discussed the possibility of proceeding with left shoulder arthroscopy and rotator cuff repair.  Will plan to see you in early January for further discussion.  If you need return to clinic sooner, please contact us  to schedule an appointment.

## 2024-03-29 NOTE — Progress Notes (Signed)
 Return patient Visit  Assessment: Emily Mata is a 63 y.o. female with the following: 1.  High-grade partial rotator cuff tear of the left shoulder  Plan: Emily Mata continues to have pain in the left shoulder.  Prior steroid injection provided some relief of her symptoms.  However, the pain is progressively worsening.  It is affecting her daily activities.  Exercises have not been helpful.  We reviewed the MRI in clinic today which demonstrates a high-grade partial-thickness tear of the left rotator cuff, and I recommended consideration for surgery.  She is interested.  However, she would like to wait until the new year.  As such, she will schedule an appointment in early January, 2026 to return to clinic and discuss surgery in more detail.  Follow-up: Return for 7-8 weeks for further discussion regarding surgery .  Subjective:  Chief Complaint  Patient presents with   Shoulder Pain    L still having good and bad days wpain    History of Present Illness: Emily Mata is a 63 y.o. female who returns for evaluation of left shoulder pain.  She is left-hand dominant.  She continues to have pain in the left shoulder.  She notes that it is progressively worsening.  No specific injury.  She describes it as a constant ache.  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  She has obtained an MRI, and is here to discuss the findings.   Review of Systems: No fevers or chills No numbness or tingling No chest pain No shortness of breath No bowel or bladder dysfunction No GI distress No headaches    Objective: LMP 10/01/2020 (Exact Date)   Physical Exam:  General: Alert and oriented. and No acute distress. Gait: Normal gait.  Left shoulder without deformity.  She has good range of motion, with some obvious discomfort.  4/5 strength in the supraspinatus and infraspinatus.  Positive Jobe.  Positive O'Brien's.  Sensation is intact throughout the left upper extremity..   Fingers are warm and well-perfused.  IMAGING: I personally ordered and reviewed the following images   Left shoulder MRI  IMPRESSION: 1. Moderate supraspinatus tendinosis with a large high-grade partial-thickness bursal surface tear measuring 13 mm medial-lateral and 6 mm AP with a full-thickness tear along the posterior margin measuring 7 mm AP and 13 mm medial-lateral. 2. Mild subscapularis tendinosis. 3. Mild tendinosis of the intra-articular portion of the long head of the biceps tendon.      New Medications:  No orders of the defined types were placed in this encounter.     Emily DELENA Horde, MD  03/29/2024 9:13 AM

## 2024-05-17 ENCOUNTER — Ambulatory Visit: Admitting: Orthopedic Surgery

## 2024-05-17 ENCOUNTER — Encounter: Payer: Self-pay | Admitting: Orthopedic Surgery

## 2024-05-17 DIAGNOSIS — M75112 Incomplete rotator cuff tear or rupture of left shoulder, not specified as traumatic: Secondary | ICD-10-CM

## 2024-05-17 NOTE — Patient Instructions (Signed)
 Preoperative Instructions  Your surgery will be at [facility], scheduled with Dr Oneil Horde   The hospital will contact you with a preoperative appointment to discuss Anesthesia. The phone number is [phone]   Please bring your medications with you for the appointment.  They will tell you the arrival time and medication instructions when you have your preoperative evaluation.  Do not wear nail polish the day of your surgery and if you take Phentermine you need to stop this medication ONE WEEK prior to your surgery.    If you take an blood thinning medication, we will need to stop this prior to surgery.  Typically, we stop this medicine at least 5 days prior to surgery.  We will need to confirm this with the doctor who prescribes this medication.  If you are taking medications or an injection for diabetes, or for weight management, this medicine will need to be stopped at least 7 days prior to surgery.     Surgery will be scheduled for 06/06/24 pending authorization by your insurance company.   Pain medicine policy:  Per [facility] clinic policy, our goal is ensure optimal postoperative pain control with a multimodal pain management strategy.   For all [facility] patients, our goal is to wean post-operative narcotic medications by 6 weeks post-operatively.   If this is not possible due to utilization of pain medication prior to surgery, your [facility] doctor will support your acute post-operative pain control for the first 6 weeks postoperatively, with a plan to transition you back to your primary pain team following that.   [facility] will work to ensure a therapist, occupational.

## 2024-05-17 NOTE — Progress Notes (Signed)
 Orthopaedic Clinic Return  Assessment & Plan: Emily Mata is a 63 y.o. female with the following: 1. Incomplete tear of left rotator cuff, unspecified whether traumatic; high grade partial thickness without retraction   Assessment and Plan Assessment & Plan High-grade partial rotator cuff tear of the left shoulder Chronic high-grade partial-thickness tear causing persistent pain and functional limitation. Conservative management ineffective. Surgical intervention indicated due to chronicity and impact on dominant arm. Tendon healing not guaranteed but may improve pain and function. Risks include infection, blood loss, anesthesia complications, and possible open incision. Recovery requires prolonged rehabilitation, full recovery expected in 6 months or longer. - Planned left shoulder arthroscopy with debridement, rotator cuff repair, and possible patch augmentation on June 06, 2024. - Initiated preoperative authorization. - Requested surgical clearance from primary care physician. - Advised continuation of acetaminophen  and ibuprofen  as needed for pain until surgery. - Recommended ice application to shoulder after work as needed. - Reviewed perioperative process, including preoperative hospital appointment for anesthesia evaluation and surgical consent. - Discussed postoperative care: sling immobilization for 4-6 weeks, initiation of physical therapy approximately 2 weeks postoperatively with gradual progression from gentle motion to strengthening over several months. - Provided anticipatory guidance regarding risks (infection, blood loss, anesthesia-related complications, possible need for open incision) and benefits (pain relief, improved function even with partial tendon healing). - Instructed coordination of timing for annual physical and surgical clearance with primary care physician.       Follow-up: Return for After surgery.   Subjective:  Chief Complaint  Patient  presents with   Left shoulder pain    Chronic.  Interested in discussing surgery     Discussed the use of AI scribe software for clinical note transcription with the patient, who gave verbal consent to proceed.  History of Present Illness Emily Mata is a 64 year old female with a high-grade partial rotator cuff tear of the left shoulder who presents with chronic left shoulder pain and functional impairment.  Chronic left shoulder pain began in June 2025, initially with inability to raise the arm. 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  She denies specific trauma. She has pain and pulling with arm elevation, reaching behind her back, and resisted movements. Acetaminophen  and ibuprofen  provide minimal relief. A prior left shoulder corticosteroid injection gave only a few days of relief before symptoms returned to baseline. No other treatments have been tried.    Review of Systems: No fevers or chills No numbness or tingling No chest pain No shortness of breath No bowel or bladder dysfunction No GI distress No headaches   Objective: LMP 10/01/2020   Physical Exam:  Physical Exam MUSCULOSKELETAL: Near full range of motion in the left shoulder. Pain and discomfort with resisted abduction.  Positive Jobe.  Negative belly press.  Pressure with resisted forward flexion. No pain with resisted internal rotation. Pulling sensation with reaching behind back.  Positive O'Brien's    IMAGING: I personally ordered and reviewed the following images:  Left shoulder MRI  IMPRESSION: 1. Moderate supraspinatus tendinosis with a large high-grade partial-thickness bursal surface tear measuring 13 mm medial-lateral and 6 mm AP with a full-thickness tear along the posterior margin measuring 7 mm AP and 13 mm medial-lateral. 2. Mild  subscapularis tendinosis. 3. Mild tendinosis of the intra-articular portion of the long head of the biceps tendon.   Portions  of this note were completed with dictation software and mistakes or typos may exist.   Emily DELENA Horde, MD 05/17/2024 10:59 AM

## 2024-05-18 NOTE — Addendum Note (Signed)
 Addended by: VICENTA EMMIE HERO on: 05/18/2024 09:01 AM   Modules accepted: Orders

## 2024-05-23 ENCOUNTER — Telehealth: Payer: Self-pay | Admitting: Orthopedic Surgery

## 2024-05-23 NOTE — Telephone Encounter (Signed)
 Patient provided a fax number of [phone] for FMLA forms to be faxed. I faxed to this number provided by patient.

## 2024-06-01 ENCOUNTER — Telehealth: Payer: Self-pay | Admitting: Orthopedic Surgery

## 2024-06-01 NOTE — Telephone Encounter (Signed)
 Dr. Onesimo pt - spoke w/the pt, she is scheduled for surgery Monday, coming from [place], she wants to know if she needs to rs her surgery w/the possible inclement weather coming our way.  She would like a call back [phone]

## 2024-06-01 NOTE — Patient Instructions (Signed)
 "       Emily Mata  06/01/2024     @[email or web address] @   Your procedure is scheduled on  06/06/2024.   Report to [facility] at  [phone] A.M.   Call this number if you have problems the morning of surgery:  [phone]  If you experience any cold or flu symptoms such as cough, fever, chills, shortness of breath, etc. between now and your scheduled surgery, please notify us  at the above number.   Remember:  Do not eat after midnight.   You may drink clear liquids until  0400 am on 06/06/2024.       Clear liquids allowed are:                    Water, Carbonated beverages (diabetics please choose diet or no sugar options), Black Coffee Only (No creamer, milk or cream, including half & half and powdered creamer), and Clear Sports drink (No red color; diabetics please choose diet or no sugar options)    Take these medicines the morning of surgery with A SIP OF WATER                                                None.    Do not wear jewelry, make-up or nail polish, including gel polish,  artificial nails, or any other type of covering on natural nails (fingers and  toes).  Do not wear lotions, powders, or perfumes, or deodorant.  Do not shave 48 hours prior to surgery.  Men may shave face and neck.  Do not bring valuables to the hospital.  [facility] is not responsible for any belongings or valuables.  Contacts, dentures or bridgework may not be worn into surgery.  Leave your suitcase in the car.  After surgery it may be brought to your room.  For patients admitted to the hospital, discharge time will be determined by your treatment team.  Patients discharged the day of surgery will not be allowed to drive home and must have someone with them for 24 hours.    Special instructions:   DO NOT smoke tobacco or vape for 24 hours before your procedure.  Please read over the following fact sheets that you were given. Pain Booklet, Coughing and Deep Breathing, Surgical Site  Infection Prevention, Anesthesia Post-op Instructions, and Care and Recovery After Surgery       Surgery for a Rotator Cuff Tear: What to Know After After surgery for a rotator cuff tear, it's common to have swelling and pain. You may also have: A small amount of fluid coming from the cuts that were made on your shoulder. Stiffness. This will get better over time. Follow these instructions at home: If you have a sling or an immobilizer: Wear the sling or immobilizer as told. Take it off only if your health care provider says you can. Loosen the sling or immobilizer if your fingers tingle, are numb, or turn cold and blue. Keep the sling or immobilizer clean and dry. Bathing Do not take baths, swim, or use a hot tub until you're told it's OK. Ask if you can shower. Keep your bandage dry until your provider says it can be removed. If your sling or shoulder immobilizer isn't waterproof: Do not let it get wet. Cover it when you take a bath or shower. Use a  cover that doesn't let any water in. Once the sling or immobilizer is removed, try not to move your shoulder until your provider says you can. Caring for your cuts from surgery  Take care of your cuts from surgery as told. Make sure you: Wash your hands with soap and water for at least 20 seconds before and after you change your bandage. If you can't use soap and water, use hand sanitizer. Change your bandage. Leave stitches or skin glue alone. Leave tape strips alone unless you're told to take them off. You may trim the edges of the tape strips if they curl up. Check the area around your cuts every day for signs of infection. Check for: More redness, swelling, or pain. More fluid or blood. Warmth. Pus or a bad smell. Managing pain, stiffness, and swelling  Use ice or an ice pack as told. Place a towel between your skin and the ice. Leave the ice on for 20 minutes, 2-3 times a day. If your skin turns red, take off the ice right  away to prevent skin damage. The risk of damage is higher if you can't feel pain, heat, or cold. Move your fingers often to reduce stiffness and swelling. Raise your upper body above the level of your heart when you lie down and when you sleep. Do not sleep on your belly. Do not sleep on the side that your surgery was done on. Medicines Take your medicines only as told. You may need to take steps to help treat or prevent trouble pooping (constipation), such as: Taking medicines to help you poop. Eating foods high in fiber, like beans, whole grains, and fresh fruits and vegetables. Drinking more fluids as told. Ask your provider if it's safe to drive or use machines while taking your medicine. Driving If you were given a sedative, do not drive or use machines until you're told it's safe. A sedative can make you sleepy. Ask when it's safe to drive if you have a sling or immobilizer on your arm. Activity Do not use your arm to support your body weight until your provider says it's OK. Do not lift or hold anything with your arm until your provider says it's OK. Exercise as told. Ask what things are safe for you to do at home. Ask when you can go back to work or school. General instructions Do not smoke, vape, or use nicotine or tobacco. Doing this can slow down healing. Keep all follow-up visits. Your provider will check how your rotator cuff is healing. Your provider may give you more instructions. Make sure you know what you can and can't do. Contact a health care provider if: You have a fever or chills. You have any signs of infection. You have very bad pain. A cut from surgery opens. Get help right away if: You have trouble breathing. You have chest pain. You loosen your sling or immobilizer but your fingers still: Tingle. Are numb. Are cold and blue. This information is not intended to replace advice given to you by your health care provider. Make sure you discuss any questions  you have with your health care provider. Document Revised: 04/01/2023 Document Reviewed: 04/01/2023 Elsevier Patient Education  2025 [employer]. How to Use Chlorhexidine  at Home in the Shower Chlorhexidine  gluconate (CHG) is a germ-killing (antiseptic) wash that's used to clean the skin. It can get rid of the germs that normally live on the skin and can keep them away for about 24 hours. If you're  having surgery, you may be told to shower with CHG at home the night before surgery. This can help lower your risk for infection. To use CHG wash in the shower, follow the steps below. Supplies needed: CHG body wash. Clean washcloth. Clean towel. How to use CHG in the shower Follow these steps unless you're told to use CHG in a different way: Start the shower. Use your normal soap and shampoo to wash your face and hair. Turn off the shower or move out of the shower stream. Pour CHG onto a clean washcloth. Do not use any type of brush or rough sponge. Start at your neck, washing your body down to your toes. Make sure you: Wash the part of your body where the surgery will be done for at least 1 minute. Do not scrub. Do not use CHG on your head or face unless your health care provider tells you to. If it gets into your ears or eyes, rinse them well with water. Do not wash your genitals with CHG. Wash your back and under your arms. Make sure to wash skin folds. Let the CHG sit on your skin for 1-2 minutes or as long as told. Rinse your entire body in the shower, including all body creases and folds. Turn off the shower. Dry off with a clean towel. Do not put anything on your skin afterward, such as powder, lotion, or perfume. Put on clean clothes or pajamas. If it's the night before surgery, sleep in clean sheets. General tips Use CHG only as told, and follow the instructions on the label. Use the full amount of CHG as told. This is often one bottle. Do not smoke and stay away from flames  after using CHG. Your skin may feel sticky after using CHG. This is normal. The sticky feeling will go away as the CHG dries. Do not use CHG: If you have a chlorhexidine  allergy or have reacted to chlorhexidine  in the past. On open wounds or areas of skin that have broken skin, cuts, or scrapes. On babies younger than 66 months of age. Contact a health care provider if: You have questions about using CHG. Your skin gets irritated or itchy. You have a rash after using CHG. You swallow any CHG. Call your local poison control center [phone] in the [place] Your eyes itch badly, or they become very red or swollen. Your hearing changes. You have trouble seeing. If you can't reach your provider, go to an urgent care or emergency room. Do not drive yourself. Get help right away if: You have swelling or tingling in your mouth or throat. You make high-pitched whistling sounds when you breathe, most often when you breathe out (wheeze). You have trouble breathing. These symptoms may be an emergency. Call 911 right away. Do not wait to see if the symptoms will go away. Do not drive yourself to the hospital. This information is not intended to replace advice given to you by your health care provider. Make sure you discuss any questions you have with your health care provider. Document Revised: 11/11/2022 Document Reviewed: 11/07/2021 Elsevier Patient Education  2024 [employer].How to Use an Incentive Spirometer An incentive spirometer is a tool that measures how well you are filling your lungs with each breath. Learning to take long, deep breaths using this tool can help you keep your lungs clear and active. This may help to reverse or lessen your chance of developing breathing (pulmonary) problems, especially infection. You may be asked to use  a spirometer: After a surgery. If you have a lung problem or a history of smoking. After a long period of time when you have been unable to move or be  active. If the spirometer includes an indicator to show the highest number that you have reached, your health care provider or respiratory therapist will help you set a goal. Keep a log of your progress as told by your health care provider. What are the risks? Breathing too quickly may cause dizziness or cause you to pass out. Take your time so you do not get dizzy or light-headed. If you are in pain, you may need to take pain medicine before doing incentive spirometry. It is harder to take a deep breath if you are having pain. How to use your incentive spirometer  Sit up on the edge of your bed or on a chair. Hold the incentive spirometer so that it is in an upright position. Before you use the spirometer, breathe out normally. Place the mouthpiece in your mouth. Make sure your lips are closed tightly around it. Breathe in slowly and as deeply as you can through your mouth, causing the piston or the ball to rise toward the top of the chamber. Hold your breath for 3-5 seconds, or for as long as possible. If the spirometer includes a coach indicator, use this to guide you in breathing. Slow down your breathing if the indicator goes above the marked areas. Remove the mouthpiece from your mouth and breathe out normally. The piston or ball will return to the bottom of the chamber. Rest for a few seconds, then repeat the steps 10 or more times. Take your time and take a few normal breaths between deep breaths so that you do not get dizzy or light-headed. Do this every 1-2 hours when you are awake. If the spirometer includes a goal marker to show the highest number you have reached (best effort), use this as a goal to work toward during each repetition. After each set of 10 deep breaths, cough a few times. This will help to make sure that your lungs are clear. If you have an incision on your chest or abdomen from surgery, place a pillow or a rolled-up towel firmly against the incision when you cough.  This can help to reduce pain while taking deep breaths and coughing. General tips When you are able to get out of bed: Walk around often. Continue to take deep breaths and cough in order to clear your lungs. Keep using the incentive spirometer until your health care provider says it is okay to stop using it. If you have been in the hospital, you may be told to keep using the spirometer at home. Contact a health care provider if: You are having difficulty using the spirometer. You have trouble using the spirometer as often as instructed. Your pain medicine is not giving enough relief for you to use the spirometer as told. You have a fever. Get help right away if: You develop shortness of breath. You develop a cough with bloody mucus from the lungs. You have fluid or blood coming from an incision site after you cough. Summary An incentive spirometer is a tool that can help you learn to take long, deep breaths to keep your lungs clear and active. You may be asked to use a spirometer after a surgery, if you have a lung problem or a history of smoking, or if you have been inactive for a long period of time. Use  your incentive spirometer as instructed every 1-2 hours while you are awake. If you have an incision on your chest or abdomen, place a pillow or a rolled-up towel firmly against your incision when you cough. This will help to reduce pain. Get help right away if you have shortness of breath, you cough up bloody mucus, or blood comes from your incision when you cough. This information is not intended to replace advice given to you by your health care provider. Make sure you discuss any questions you have with your health care provider. Document Revised: 03/06/2023 Document Reviewed: 03/06/2023 Elsevier Patient Education  2024 [employer]. "

## 2024-06-02 ENCOUNTER — Encounter (HOSPITAL_COMMUNITY)
Admission: RE | Admit: 2024-06-02 | Discharge: 2024-06-02 | Disposition: A | Discharge status: Home / Self Care | Source: Ambulatory Visit | Attending: Orthopedic Surgery | Admitting: Orthopedic Surgery

## 2024-06-02 ENCOUNTER — Encounter (HOSPITAL_COMMUNITY): Payer: Self-pay

## 2024-06-02 ENCOUNTER — Other Ambulatory Visit: Payer: Self-pay

## 2024-06-02 VITALS — BP 135/76 | HR 65 | Resp 18 | Ht 66.0 in | Wt 131.0 lb

## 2024-06-02 DIAGNOSIS — Z01812 Encounter for preprocedural laboratory examination: Secondary | ICD-10-CM | POA: Insufficient documentation

## 2024-06-02 DIAGNOSIS — Z01818 Encounter for other preprocedural examination: Secondary | ICD-10-CM

## 2024-06-02 DIAGNOSIS — Z862 Personal history of diseases of the blood and blood-forming organs and certain disorders involving the immune mechanism: Secondary | ICD-10-CM | POA: Insufficient documentation

## 2024-06-02 LAB — BASIC METABOLIC PANEL WITH GFR
Anion gap: 13 (ref 5–15)
BUN: 19 mg/dL (ref 8–23)
CO2: 22 mmol/L (ref 22–32)
Calcium: 9.9 mg/dL (ref 8.9–10.3)
Chloride: 105 mmol/L (ref 98–111)
Creatinine, Ser: 0.9 mg/dL (ref 0.44–1.00)
GFR, Estimated: >60 mL/min
Glucose, Bld: 90 mg/dL (ref 70–99)
Potassium: 4.1 mmol/L (ref 3.5–5.1)
Sodium: 140 mmol/L (ref 135–145)

## 2024-06-02 LAB — CBC
HCT: 42.4 % (ref 36.0–46.0)
Hemoglobin: 14.7 g/dL (ref 12.0–15.0)
MCH: 31.7 pg (ref 26.0–34.0)
MCHC: 34.7 g/dL (ref 30.0–36.0)
MCV: 91.6 fL (ref 80.0–100.0)
Platelets: 178 K/uL (ref 150–400)
RBC: 4.63 MIL/uL (ref 3.87–5.11)
RDW: 13.1 % (ref 11.5–15.5)
WBC: 4.2 K/uL (ref 4.0–10.5)
nRBC: 0 % (ref 0.0–0.2)

## 2024-06-13 ENCOUNTER — Encounter (HOSPITAL_COMMUNITY): Payer: Self-pay | Admitting: Certified Registered"

## 2024-06-14 ENCOUNTER — Telehealth: Payer: Self-pay | Admitting: Orthopedic Surgery

## 2024-06-14 NOTE — Telephone Encounter (Signed)
 Dr. Onesimo pt - pt lvm this morning that she would like a callback to reschedule her surgery that was cancelled due to the weather.  [phone]

## 2024-06-16 NOTE — Telephone Encounter (Signed)
 Called and pt would like to proceed with 2/23 for surgery.

## 2024-06-29 ENCOUNTER — Other Ambulatory Visit (HOSPITAL_COMMUNITY)

## 2024-07-04 ENCOUNTER — Encounter (HOSPITAL_COMMUNITY): Admission: RE | Payer: Self-pay | Source: Home / Self Care

## 2024-07-04 ENCOUNTER — Ambulatory Visit (HOSPITAL_COMMUNITY): Admission: RE | Admit: 2024-07-04 | Source: Home / Self Care | Admitting: Orthopedic Surgery
